# Patient Record
Sex: Male | Born: 1935 | Race: White | Hispanic: No | Smoking: Former smoker
Health system: Southern US, Community
[De-identification: ages and names within clinical notes are randomized; demographics above are authoritative.]

## PROBLEM LIST (undated history)

## (undated) DIAGNOSIS — G4730 Sleep apnea, unspecified: Secondary | ICD-10-CM

## (undated) DIAGNOSIS — C801 Malignant (primary) neoplasm, unspecified: Secondary | ICD-10-CM

## (undated) DIAGNOSIS — J449 Chronic obstructive pulmonary disease, unspecified: Secondary | ICD-10-CM

## (undated) DIAGNOSIS — E785 Hyperlipidemia, unspecified: Secondary | ICD-10-CM

## (undated) DIAGNOSIS — I10 Essential (primary) hypertension: Secondary | ICD-10-CM

## (undated) HISTORY — PX: JOINT REPLACEMENT: SHX530

## (undated) HISTORY — DX: Hyperlipidemia, unspecified: E78.5

## (undated) HISTORY — PX: CHOLECYSTECTOMY: SHX55

## (undated) HISTORY — PX: AORTIC VALVE REPLACEMENT: SHX41

## (undated) HISTORY — DX: Chronic obstructive pulmonary disease, unspecified: J44.9

## (undated) HISTORY — PX: PROSTATE SURGERY: SHX751

## (undated) HISTORY — DX: Sleep apnea, unspecified: G47.30

## (undated) HISTORY — DX: Essential (primary) hypertension: I10

---

## 1997-12-01 ENCOUNTER — Ambulatory Visit (HOSPITAL_COMMUNITY): Admission: RE | Admit: 1997-12-01 | Discharge: 1997-12-01 | Payer: Self-pay | Admitting: Orthopedic Surgery

## 1998-02-02 ENCOUNTER — Inpatient Hospital Stay (HOSPITAL_COMMUNITY): Admission: RE | Admit: 1998-02-02 | Discharge: 1998-02-06 | Payer: Self-pay | Admitting: Orthopedic Surgery

## 1998-02-08 ENCOUNTER — Encounter (HOSPITAL_COMMUNITY): Admission: RE | Admit: 1998-02-08 | Discharge: 1998-05-09 | Payer: Self-pay | Admitting: Orthopedic Surgery

## 1998-06-25 ENCOUNTER — Inpatient Hospital Stay (HOSPITAL_COMMUNITY): Admission: EM | Admit: 1998-06-25 | Discharge: 1998-07-05 | Payer: Self-pay | Admitting: Cardiology

## 1998-06-25 ENCOUNTER — Encounter: Payer: Self-pay | Admitting: Cardiology

## 1998-06-27 ENCOUNTER — Encounter: Payer: Self-pay | Admitting: Cardiothoracic Surgery

## 1998-06-28 ENCOUNTER — Encounter: Payer: Self-pay | Admitting: Cardiothoracic Surgery

## 1998-06-29 ENCOUNTER — Encounter: Payer: Self-pay | Admitting: Cardiothoracic Surgery

## 1998-06-30 ENCOUNTER — Encounter: Payer: Self-pay | Admitting: Cardiothoracic Surgery

## 1998-07-01 ENCOUNTER — Encounter: Payer: Self-pay | Admitting: Cardiothoracic Surgery

## 1998-07-25 ENCOUNTER — Encounter (HOSPITAL_COMMUNITY): Admission: RE | Admit: 1998-07-25 | Discharge: 1998-10-23 | Payer: Self-pay | Admitting: Internal Medicine

## 1999-03-18 ENCOUNTER — Emergency Department (HOSPITAL_COMMUNITY): Admission: EM | Admit: 1999-03-18 | Discharge: 1999-03-18 | Payer: Self-pay

## 1999-11-01 ENCOUNTER — Ambulatory Visit (HOSPITAL_COMMUNITY): Admission: RE | Admit: 1999-11-01 | Discharge: 1999-11-01 | Payer: Self-pay | Admitting: Cardiology

## 2000-07-06 ENCOUNTER — Emergency Department (HOSPITAL_COMMUNITY): Admission: EM | Admit: 2000-07-06 | Discharge: 2000-07-06 | Payer: Self-pay | Admitting: Emergency Medicine

## 2000-07-06 ENCOUNTER — Encounter: Payer: Self-pay | Admitting: Emergency Medicine

## 2000-07-08 ENCOUNTER — Encounter: Payer: Self-pay | Admitting: Internal Medicine

## 2000-07-08 ENCOUNTER — Encounter: Payer: Self-pay | Admitting: Emergency Medicine

## 2000-07-08 ENCOUNTER — Encounter (INDEPENDENT_AMBULATORY_CARE_PROVIDER_SITE_OTHER): Payer: Self-pay | Admitting: *Deleted

## 2000-07-08 ENCOUNTER — Inpatient Hospital Stay (HOSPITAL_COMMUNITY): Admission: EM | Admit: 2000-07-08 | Discharge: 2000-07-15 | Payer: Self-pay

## 2000-07-14 ENCOUNTER — Encounter: Payer: Self-pay | Admitting: Internal Medicine

## 2000-10-26 ENCOUNTER — Ambulatory Visit (HOSPITAL_COMMUNITY): Admission: RE | Admit: 2000-10-26 | Discharge: 2000-10-26 | Payer: Self-pay | Admitting: Gastroenterology

## 2000-12-04 ENCOUNTER — Inpatient Hospital Stay (HOSPITAL_COMMUNITY): Admission: RE | Admit: 2000-12-04 | Discharge: 2000-12-05 | Payer: Self-pay | Admitting: Urology

## 2003-09-11 ENCOUNTER — Emergency Department (HOSPITAL_COMMUNITY): Admission: EM | Admit: 2003-09-11 | Discharge: 2003-09-11 | Payer: Self-pay | Admitting: Emergency Medicine

## 2004-01-31 ENCOUNTER — Encounter: Admission: RE | Admit: 2004-01-31 | Discharge: 2004-01-31 | Payer: Self-pay

## 2004-11-05 ENCOUNTER — Encounter: Admission: RE | Admit: 2004-11-05 | Discharge: 2004-11-05 | Payer: Self-pay | Admitting: Cardiology

## 2004-11-05 ENCOUNTER — Ambulatory Visit (HOSPITAL_COMMUNITY): Admission: RE | Admit: 2004-11-05 | Discharge: 2004-11-05 | Payer: Self-pay | Admitting: Cardiology

## 2004-12-17 ENCOUNTER — Ambulatory Visit (HOSPITAL_COMMUNITY): Admission: RE | Admit: 2004-12-17 | Discharge: 2004-12-17 | Payer: Self-pay | Admitting: Neurology

## 2005-02-10 ENCOUNTER — Encounter: Admission: RE | Admit: 2005-02-10 | Discharge: 2005-02-10 | Payer: Self-pay | Admitting: Cardiology

## 2005-04-22 ENCOUNTER — Emergency Department (HOSPITAL_COMMUNITY): Admission: EM | Admit: 2005-04-22 | Discharge: 2005-04-22 | Payer: Self-pay | Admitting: Emergency Medicine

## 2005-06-10 ENCOUNTER — Ambulatory Visit: Payer: Self-pay | Admitting: Internal Medicine

## 2005-07-09 ENCOUNTER — Emergency Department (HOSPITAL_COMMUNITY): Admission: EM | Admit: 2005-07-09 | Discharge: 2005-07-09 | Payer: Self-pay | Admitting: Emergency Medicine

## 2005-07-30 ENCOUNTER — Ambulatory Visit: Payer: Self-pay | Admitting: Internal Medicine

## 2005-08-13 ENCOUNTER — Encounter: Payer: Self-pay | Admitting: Orthopedic Surgery

## 2005-08-20 ENCOUNTER — Ambulatory Visit (HOSPITAL_COMMUNITY): Admission: RE | Admit: 2005-08-20 | Discharge: 2005-08-20 | Payer: Self-pay | Admitting: Urology

## 2005-09-12 ENCOUNTER — Ambulatory Visit: Payer: Self-pay | Admitting: Internal Medicine

## 2005-10-08 ENCOUNTER — Emergency Department (HOSPITAL_COMMUNITY): Admission: EM | Admit: 2005-10-08 | Discharge: 2005-10-08 | Payer: Self-pay | Admitting: Emergency Medicine

## 2006-05-11 ENCOUNTER — Emergency Department (HOSPITAL_COMMUNITY): Admission: EM | Admit: 2006-05-11 | Discharge: 2006-05-11 | Payer: Self-pay | Admitting: Emergency Medicine

## 2006-06-24 ENCOUNTER — Ambulatory Visit (HOSPITAL_BASED_OUTPATIENT_CLINIC_OR_DEPARTMENT_OTHER): Admission: RE | Admit: 2006-06-24 | Discharge: 2006-06-24 | Payer: Self-pay | Admitting: Otolaryngology

## 2006-06-25 ENCOUNTER — Encounter: Payer: Self-pay | Admitting: Internal Medicine

## 2006-06-28 ENCOUNTER — Ambulatory Visit: Payer: Self-pay | Admitting: Internal Medicine

## 2006-10-20 ENCOUNTER — Ambulatory Visit: Payer: Self-pay | Admitting: Internal Medicine

## 2006-12-01 ENCOUNTER — Ambulatory Visit: Payer: Self-pay | Admitting: Internal Medicine

## 2006-12-31 ENCOUNTER — Ambulatory Visit: Payer: Self-pay | Admitting: Internal Medicine

## 2007-01-14 ENCOUNTER — Encounter (HOSPITAL_COMMUNITY): Admission: RE | Admit: 2007-01-14 | Discharge: 2007-02-16 | Payer: Self-pay | Admitting: Internal Medicine

## 2007-03-03 ENCOUNTER — Encounter: Payer: Self-pay | Admitting: Internal Medicine

## 2007-03-16 ENCOUNTER — Encounter (HOSPITAL_COMMUNITY): Admission: RE | Admit: 2007-03-16 | Discharge: 2007-04-13 | Payer: Self-pay | Admitting: Internal Medicine

## 2007-04-12 ENCOUNTER — Encounter: Payer: Self-pay | Admitting: Internal Medicine

## 2007-04-15 ENCOUNTER — Encounter (HOSPITAL_COMMUNITY): Admission: RE | Admit: 2007-04-15 | Discharge: 2007-05-14 | Payer: Self-pay | Admitting: Internal Medicine

## 2007-05-03 ENCOUNTER — Encounter: Payer: Self-pay | Admitting: Internal Medicine

## 2007-05-03 DIAGNOSIS — G4733 Obstructive sleep apnea (adult) (pediatric): Secondary | ICD-10-CM

## 2007-05-03 DIAGNOSIS — J449 Chronic obstructive pulmonary disease, unspecified: Secondary | ICD-10-CM

## 2007-05-03 DIAGNOSIS — J4489 Other specified chronic obstructive pulmonary disease: Secondary | ICD-10-CM | POA: Insufficient documentation

## 2007-05-16 ENCOUNTER — Encounter (HOSPITAL_COMMUNITY): Admission: RE | Admit: 2007-05-16 | Discharge: 2007-08-14 | Payer: Self-pay | Admitting: Internal Medicine

## 2007-06-25 ENCOUNTER — Ambulatory Visit: Payer: Self-pay | Admitting: Internal Medicine

## 2007-06-26 DIAGNOSIS — I10 Essential (primary) hypertension: Secondary | ICD-10-CM | POA: Insufficient documentation

## 2007-06-26 DIAGNOSIS — E785 Hyperlipidemia, unspecified: Secondary | ICD-10-CM

## 2007-12-27 ENCOUNTER — Ambulatory Visit: Payer: Self-pay | Admitting: Internal Medicine

## 2008-03-06 ENCOUNTER — Telehealth: Payer: Self-pay | Admitting: Internal Medicine

## 2008-03-15 ENCOUNTER — Encounter: Payer: Self-pay | Admitting: Internal Medicine

## 2008-06-26 ENCOUNTER — Ambulatory Visit: Payer: Self-pay | Admitting: Internal Medicine

## 2008-10-02 ENCOUNTER — Telehealth (INDEPENDENT_AMBULATORY_CARE_PROVIDER_SITE_OTHER): Payer: Self-pay | Admitting: *Deleted

## 2008-12-25 ENCOUNTER — Ambulatory Visit: Payer: Self-pay | Admitting: Internal Medicine

## 2009-06-26 ENCOUNTER — Ambulatory Visit: Payer: Self-pay | Admitting: Internal Medicine

## 2009-08-06 ENCOUNTER — Telehealth: Payer: Self-pay | Admitting: Internal Medicine

## 2009-08-09 ENCOUNTER — Telehealth (INDEPENDENT_AMBULATORY_CARE_PROVIDER_SITE_OTHER): Payer: Self-pay | Admitting: *Deleted

## 2009-08-10 ENCOUNTER — Ambulatory Visit: Payer: Self-pay | Admitting: Internal Medicine

## 2009-08-31 ENCOUNTER — Telehealth (INDEPENDENT_AMBULATORY_CARE_PROVIDER_SITE_OTHER): Payer: Self-pay | Admitting: *Deleted

## 2009-09-12 HISTORY — PX: SHOULDER SURGERY: SHX246

## 2009-09-21 ENCOUNTER — Inpatient Hospital Stay (HOSPITAL_COMMUNITY): Admission: RE | Admit: 2009-09-21 | Discharge: 2009-09-23 | Payer: Self-pay | Admitting: Orthopedic Surgery

## 2009-12-07 ENCOUNTER — Ambulatory Visit: Payer: Self-pay | Admitting: Internal Medicine

## 2010-01-03 ENCOUNTER — Ambulatory Visit (HOSPITAL_BASED_OUTPATIENT_CLINIC_OR_DEPARTMENT_OTHER): Admission: RE | Admit: 2010-01-03 | Discharge: 2010-01-03 | Payer: Self-pay | Admitting: Orthopedic Surgery

## 2010-02-23 ENCOUNTER — Emergency Department (HOSPITAL_COMMUNITY): Admission: EM | Admit: 2010-02-23 | Discharge: 2010-02-23 | Payer: Self-pay | Admitting: Emergency Medicine

## 2010-03-05 ENCOUNTER — Emergency Department (HOSPITAL_COMMUNITY): Admission: EM | Admit: 2010-03-05 | Discharge: 2010-03-05 | Payer: Self-pay | Admitting: Emergency Medicine

## 2010-04-21 ENCOUNTER — Encounter: Payer: Self-pay | Admitting: Internal Medicine

## 2010-05-14 NOTE — Progress Notes (Signed)
Summary: sob-APPT SCHED  Phone Note Call from Patient Call back at Home Phone [phone]   Caller: Patient Call For: young Reason for Call: Talk to Nurse Summary of Call: sob, can't walk across room without rest.  Wants to know if he should try an inhaler again? [facility] Initial call taken by: Eugene Gavia,  August 09, 2009 11:29 AM  Follow-up for Phone Call        Spoke with pt.  He is out of breath walking from room to room at home.  He states that he feels like he is unable to do anything anymore without getting out of breath.  OV with [facility] sched for tommorrow at 3:30 pm.  ER sooner if needed. Follow-up by: Vernie Murders,  August 09, 2009 11:43 AM

## 2010-05-14 NOTE — Assessment & Plan Note (Signed)
Summary: rov 6 months///kp   Primary Provider/Referring Provider:  Jacky Kindle  CC:  Follow up visit-SOB.Marland Kitchen  History of Present Illness: 06/26/08- OSA, COPD "Can't wear oxygen" all the time- admits partly vanity, but does wear it with cpap at night. C/O chronic throat clearing without awareness of reflux, postnasal drip or dysphagia. Denies change in exercise tolerance, productive cough, chest pain, palpitation or edema. Sleep onset easy with med but often wakes 3 AM and may need to take cpap off to get back to sleep.  January 18, 2009 OSA, COPD Asks about dysphagia- painless, several months, no choke, reflux. He turns down referral for MBS and chooses to live with it.  Otherwise  he says he feels well for his age.  Wears CPAP every night but sometimes takes it off after a few hours. He feels Ambien 10 mg and Clonazepam works about the same, providing sleep latency of 10 minutes at 10PM then waking at 3-330 AM and dozing off and on til 6-7AM without hangover. Breathing during day is satisfactory to him. He wears oxygen at night but doesn't like it during day. He paces himself and doesn't panic if he gets a little breathless.   June 26, 2009- COPD, OSA CXR 01/18/2009- COPD, NAD. CPAP 10- with O2 Asks about life-line type pendant. He lives alone at age 60. I supported this. He notes he doesn't have enough wind power to blow his nose esily. He still tries to play some golf occasionally- cart up to the green.Nadene Rubins about his grandaughter. COPD- FEV1 07/2005.FEV1 1.89, FEV1/FVC 0.60 with response to BD. Not much cough. DOE out to get paper. On return will stop at steps to get his breath. Little change since last visit. He wears oxygen 2 l sleep, but turned in his portable. Not using any bronchodilators.     Current Medications (verified): 1)  Omeprazole 20 Mg  Tbec (Omeprazole) .... Take 1 Tablet By Mouth Once A Day 2)  Felodipine 10 Mg  Tb24 (Felodipine) .... Take 1 Tablet By Mouth Once A Day 3)   Hydrochlorothiazide 25 Mg  Tabs (Hydrochlorothiazide) .... Take 1 Tablet By Mouth Once A Day 4)  Atenolol 50 Mg  Tabs (Atenolol) .... Take 1 Tablet By Mouth Once A Day 5)  Simvastatin 80 Mg  Tabs (Simvastatin) .... Take 1 Tablet By Mouth Once A Day 6)  Bayer Aspirin Ec Low Dose 81 Mg  Tbec (Aspirin) .... Take 1 Tablet By Mouth Once A Day 7)  Clonazepam 0.5 Mg Tabs (Clonazepam) .Marland Kitchen.. 1-2 At Bedtime As Needed For Sleep 8)  Amlodipine Besylate 5 Mg  Tabs (Amlodipine Besylate) .... Take 1 By Mouth Once Daily 9)  Cpap 10 With O2 2l 10)  Ambien 10 Mg Tabs (Zolpidem Tartrate) .... Take 1 Tab By Mouth At Bedtime As Needed  Allergies (verified): No Known Drug Allergies  Past History:  Past Medical History: Last updated: 06/25/2007 C O P D Sleep Apnea Hyperlipidemia Hypertension  Past Surgical History: Last updated: 06/25/2007 aortic valve replacement Cholecystectomy  Family History: Last updated: 01/18/2009 brother- COPD- died 1 sister living Father- died age 62, diabetic, CVA  Social History: Last updated: 12/27/2007 Patient states former smoker.  Wife, son and grandson are all dead  Risk Factors: Smoking Status: quit (06/25/2007)  Review of Systems      See HPI       The patient complains of dyspnea on exertion.  The patient denies anorexia, fever, weight loss, weight gain, vision loss, decreased hearing, hoarseness, chest pain, syncope,  peripheral edema, prolonged cough, headaches, hemoptysis, abdominal pain, and severe indigestion/heartburn.    Vital Signs:  Patient profile:   77 year old male Height:      72 inches Weight:      192.38 pounds O2 Sat:      91 % on Room air Pulse rate:   65 / minute BP sitting:   130 / 80  (left arm) Cuff size:   regular  Vitals Entered By: Reynaldo Minium CMA (June 26, 2009 9:02 AM)  O2 Flow:  Room air  Physical Exam  Additional Exam:  General: A/Ox3; pleasant and cooperative, NAD, medium build SKIN: no rash, lesions NODES: no  lymphadenopathy HEENT: [place]/AT, EOM- WNL, Conjuctivae- clear, PERRLA, TM-WNL, Nose- clear, Throat- clear with some cobblestoning, Mallampatii II, no stridor, voice normal for age, [name] without stridor, doing some mild throat clearing while here. NECK: Supple w/ fair ROM, JVD- none, normal carotid impulses w/o bruits Thyroid-  CHEST: Very distant breath sounds unlabored without wheeze or cough HEART: RRR, no m/g/r heard ABDOMEN soft VQQ:VZDG, nl pulses, no edema , red scaling dermatitis in stocking distribution both lower legs- noted again this visit. NEURO: Grossly intact to observation      Impression & Recommendations:  Problem # 1:  C O P D (ICD-496) Unchanged. He had response to BD in 2007, but it isn't evident now on exam. I emphasized staying active. CXR stable on review.  Problem # 2:  OBSTRUCTIVE SLEEP APNEA (ICD-327.23) He still uses cpap every night,  but is more aware of waking around 2 AM and we discussed sleep hygeine.He may still use ambien as needed.  Other Orders: Est. Patient Level III (38756)  Patient Instructions: 1)  Please schedule a follow-up appointment in 6 months. 2)  continue CPAP with oxygen at night and call as needed.

## 2010-05-14 NOTE — Progress Notes (Signed)
Summary: med records  Phone Note From Other Clinic Call back at [phone]   Caller: Diane @ Dr. Lanell Mat[facility] Office Call For: [name] [name] Summary of Call: need latest office notes on pt from Dr. Maple [name]  -  Fax# [phone] Initial call taken by: Eugene Gavia,  Aug 31, 2009 12:01 PM  Follow-up for Phone Call        Faxed notes on 5/20.//[name] Follow-up by: Darletta Moll,  Sep 03, 2009 8:09 AM

## 2010-05-14 NOTE — Assessment & Plan Note (Signed)
Summary: sob//lmr   Primary Provider/Referring Provider:  Jacky Kindle  CC:  Pt c/o SOB with exertion starting Tuesday after inhaling smoke when cooking. Pt requesting to discuss oxygen treatment and medications.  History of Present Illness: 12/25/08 OSA, COPD Asks about dysphagia- painless, several months, no choke, reflux. He turns down referral for MBS and chooses to live with it.  Otherwise  he says he feels well for his age.  Wears CPAP every night but sometimes takes it off after a few hours. He feels Ambien 10 mg and Clonazepam works about the same, providing sleep latency of 10 minutes at 10PM then waking at 3-330 AM and dozing off and on til 6-7AM without hangover. Breathing during day is satisfactory to him. He wears oxygen at night but doesn't like it during day. He paces himself and doesn't panic if he gets a little breathless.   June 26, 2009- COPD, OSA CXR 12/25/08- COPD, NAD. CPAP 10- with O2 Asks about life-line type pendant. He lives alone at age 57. I supported this. He notes he doesn't have enough wind power to blow his nose esily. He still tries to play some golf occasionally- cart up to the green.Nadene Rubins about his grandaughter. COPD- FEV1 07/2005.FEV1 1.89, FEV1/FVC 0.60 with response to BD. Not much cough. DOE out to get paper. On return will stop at steps to get his breath. Little change since last visit. He wears oxygen 2 l sleep, but turned in his portable. Not using any bronchodilators.  08-21-2009- COPD, OSA Overheated some fatback cooking. It took his breath for about 2 hours. He is back to his baseline, but the experience rattled him. He didn't have a rescue inhaler or portable oxygen.Marland Kitchen He asks again about the portable oxygen he turned back. He stil uses oxygen at night with his CPAP. He wheezes intermittently. Asks help with retained secretions.    Current Medications (verified): 1)  Omeprazole 20 Mg  Tbec (Omeprazole) .... Take 1 Tablet By Mouth Once A  Day 2)  Felodipine 10 Mg  Tb24 (Felodipine) .... Take 1 Tablet By Mouth Once A Day (Pt Unsure) 3)  Hydrochlorothiazide 25 Mg  Tabs (Hydrochlorothiazide) .... Take 1 Tablet By Mouth Once A Day 4)  Atenolol 50 Mg  Tabs (Atenolol) .... Take 1 Tablet By Mouth Once A Day 5)  Simvastatin 80 Mg  Tabs (Simvastatin) .... Take 1/2 Tablet By Mouth Once A Day 6)  Bayer Aspirin Ec Low Dose 81 Mg  Tbec (Aspirin) .... Take 1 Tablet By Mouth Once A Day 7)  Clonazepam 0.5 Mg Tabs (Clonazepam) .Marland Kitchen.. 1-2 At Bedtime As Needed For Sleep 8)  Amlodipine Besylate 5 Mg  Tabs (Amlodipine Besylate) .... Take 1 By Mouth Once Daily 9)  Cpap 10 With O2 2l 10)  Ambien 10 Mg Tabs (Zolpidem Tartrate) .... Take 1 Tab By Mouth At Bedtime As Needed  Allergies (verified): No Known Drug Allergies  Past History:  Past Medical History: Last updated: 06/25/2007 C O P D Sleep Apnea Hyperlipidemia Hypertension  Past Surgical History: Last updated: 06/25/2007 aortic valve replacement Cholecystectomy  Family History: Last updated: 08/21/2009 brother- COPD- died 1 sister living Father- died age 77, diabetic, CVA  Social History: Last updated: 12/27/2007 Patient states former smoker.  Wife, son and grandson are all dead  Risk Factors: Smoking Status: quit (06/25/2007)  Family History: brother- COPD- died 1 sister living Father- died age 8, diabetic, CVA  Review of Systems      See HPI  The  patient denies anorexia, fever, weight loss, weight gain, vision loss, decreased hearing, hoarseness, chest pain, syncope, dyspnea on exertion, peripheral edema, prolonged cough, headaches, hemoptysis, and severe indigestion/heartburn.    Vital Signs:  Patient profile:   77 year old male Height:      72 inches Weight:      192.13 pounds BMI:     26.15 O2 Sat:      86 % on Room air Pulse rate:   77 / minute BP sitting:   104 / 70  (left arm) Cuff size:   regular  Vitals Entered By: Zackery Barefoot CMA (August 10, 2009 3:56 PM)  O2 Flow:  Room air CC: Pt c/o SOB with exertion starting Tuesday after inhaling smoke when cooking. Pt requesting to discuss oxygen treatment and medications Comments Upon arrival pt's O2 86% RA, after rest pt recovered to 92% RA. Zackery Barefoot CMA  August 10, 2009 3:58 PM    Physical Exam  Additional Exam:  General: A/Ox3; pleasant and cooperative, NAD, medium build SKIN: no rash, lesions NODES: no lymphadenopathy HEENT: [place]/AT, EOM- WNL, Conjuctivae- clear, PERRLA, TM-WNL, Nose- clear, Throat- clear with some cobblestoning, Mallampatii II, no stridor, voice normal for age, [name] without stridor, doing some mild throat clearing while here. NECK: Supple w/ fair ROM, JVD- none, normal carotid impulses w/o bruits Thyroid-  CHEST: Very distant breath sounds unlabored without wheeze or cough HEART: RRR, no m/g/r heard ABDOMEN soft ZOX:WRUE, nl pulses, no edema , red scaling dermatitis in stocking distribution both lower legs- noted again this visit. NEURO: Grossly intact to observation      Impression & Recommendations:  Problem # 1:  COPD (ICD-496) He is considering accepting portable oxygen back. We discussed medical indications, noting his arrival sat of 86% today on room air he admits his reluctance is based on "vanity". We talked carefully about use of oxygen to reduce risk of cardiac event or stroke.Marland Kitchen Previous PFT did show reversibility with dilator. We will give a rescue inhlaer for as needed use to start with. He is desensitized to dyspnea and it is harder for him to see the benefit of these treatments..  Medications Added to Medication List This Visit: 1)  Felodipine 10 Mg Tb24 (Felodipine) .... Take 1 tablet by mouth once a day (pt unsure) 2)  Simvastatin 80 Mg Tabs (Simvastatin) .... Take 1/2 tablet by mouth once a day 3)  Proair Hfa 108 (90 Base) Mcg/act Aers (Albuterol sulfate) .... 2 puffs four times a day as needed rescue inhaler  Other Orders: Est.  Patient Level II (45409) Prescription Created Electronically [id number]) HFA Instruction [phone])  Patient Instructions: 1)  Keep scheduled appointment- call sooner if needed 2)  script for rescue inhaler Proair was sent to your drug store 3)  MDI instruction done by nurse. 4)  Consider letting us put portable oxygen back in your home. Prescriptions: PROAIR HFA 108 (90 BASE) MCG/ACT AERS (ALBUTEROL SULFATE) 2 puffs four times a day as needed rescue inhaler  #1 x prn   Entered and Authorized by:   Waymon Budge MD   Signed by:   Waymon Budge MD on 08/10/2009   Method used:   Electronically to        [facility]* (retail)       [address]       [place], [place]  [id number]       Ph: [phone]       Fax: [phone]   RxID:  [id number]  

## 2010-05-14 NOTE — Assessment & Plan Note (Signed)
Summary: 6 months/apc   Primary Provider/Referring Provider:  Jacky Kindle  CC:  6 month follow up visit-COPD.  History of Present Illness:  June 26, 2009- COPD, OSA CXR 12/25/08- COPD, NAD. CPAP 10- with O2 Asks about life-line type pendant. He lives alone at age 77. I supported this. He notes he doesn't have enough wind power to blow his nose esily. He still tries to play some golf occasionally- cart up to the green.Nadene Rubins about his grandaughter. COPD- FEV1 07/2005.FEV1 1.89, FEV1/FVC 0.60 with response to BD. Not much cough. DOE out to get paper. On return will stop at steps to get his breath. Little change since last visit. He wears oxygen 2 l sleep, but turned in his portable. Not using any bronchodilators.  2009/09/09- COPD, OSA Overheated some fatback cooking. It took his breath for about 2 hours. He is back to his baseline, but the experience rattled him. He didn't have a rescue inhaler or portable oxygen.Marland Kitchen He asks again about the portable oxygen he turned back. He stil uses oxygen at night with his CPAP. He wheezes intermittently. Asks help with retained secretions.  December 07, 2009- COPD, OSA After shoulder surgery in June he was dyspneic/ fatigued in PT. Found O2 sat would drop into 80's. Denies acute respiratory event. Not very active. Some cough/ white phlegm. No chest pain or edema. He continues cpap 10 with O2 2L, but he never sleeps through the night and is just catnapping after 3AM. Still takes one clonazepam at bedtime. if he runs out he may take an [place], but he doesn't combine them. Mask fits ok but annoys him. We discussed mask styles. Discussed report of dementia in people with sleep apnea who don't use CPAP.      Preventive Screening-Counseling & Management  Alcohol-Tobacco     Smoking Status: quit > 6 months     Tobacco Counseling: not to resume use of tobacco products  Current Medications (verified): 1)  Omeprazole 20 Mg  Tbec (Omeprazole) .... Take 1  Tablet By Mouth Once A Day 2)  Felodipine 10 Mg  Tb24 (Felodipine) .... Take 1 Tablet By Mouth Once A Day (Pt Unsure) 3)  Hydrochlorothiazide 25 Mg  Tabs (Hydrochlorothiazide) .... Take 1 Tablet By Mouth Once A Day 4)  Atenolol 50 Mg  Tabs (Atenolol) .... Take 1 Tablet By Mouth Once A Day 5)  Simvastatin 80 Mg  Tabs (Simvastatin) .... Take 1/2 Tablet By Mouth Once A Day 6)  Bayer Aspirin Ec Low Dose 81 Mg  Tbec (Aspirin) .... Take 1 Tablet By Mouth Once A Day 7)  Clonazepam 0.5 Mg Tabs (Clonazepam) .Marland Kitchen.. 1-2 At Bedtime As Needed For Sleep 8)  Amlodipine Besylate 5 Mg  Tabs (Amlodipine Besylate) .... Take 1 By Mouth Once Daily 9)  Cpap 10 With O2 2l 10)  Ambien 10 Mg Tabs (Zolpidem Tartrate) .... Take 1 Tab By Mouth At Bedtime As Needed 11)  Proair Hfa 108 (90 Base) Mcg/act Aers (Albuterol Sulfate) .... 2 Puffs Four Times A Day As Needed Rescue Inhaler  Allergies (verified): No Known Drug Allergies  Past History:  Past Medical History: Last updated: 06/25/2007 C O P D Sleep Apnea Hyperlipidemia Hypertension  Family History: Last updated: 09/09/09 brother- COPD- died 1 sister living Father- died age 69, diabetic, CVA  Social History: Last updated: 12/27/2007 Patient states former smoker.  Wife, son and grandson are all dead  Risk Factors: Smoking Status: quit > 6 months (12/07/2009)  Past Surgical History: aortic valve  replacement Cholecystectomy left shoulder- june, 2011  Social History: Smoking Status:  quit > 6 months  Review of Systems      See HPI       The patient complains of shortness of breath with activity and shortness of breath at rest.  The patient denies productive cough, non-productive cough, coughing up blood, chest pain, irregular heartbeats, acid heartburn, indigestion, loss of appetite, weight change, abdominal pain, difficulty swallowing, sore throat, tooth/dental problems, headaches, nasal congestion/difficulty breathing through nose, and sneezing.     Vital Signs:  Patient profile:   77 year old male Height:      72 inches Weight:      190 pounds BMI:     25.86 O2 Sat:      90 % on 2 L/min Pulse rate:   62 / minute BP sitting:   118 / 72  (left arm) Cuff size:   regular  Vitals Entered By: Reynaldo Minium CMA (December 07, 2009 2:40 PM)  O2 Flow:  2 L/min CC: 6 month follow up visit-COPD   Physical Exam  Additional Exam:  General: A/Ox3; pleasant and cooperative, NAD, medium build SKIN: no rash, lesions NODES: no lymphadenopathy HEENT: [place]/AT, EOM- WNL, Conjuctivae- clear, PERRLA, TM-WNL, Nose- clear, Throat- clear with some cobblestoning, Mallampatii II, no stridor, voice normal for age, [name] without stridor, doing some mild throat clearing while here. NECK: Supple w/ fair ROM, JVD- none, normal carotid impulses w/o bruits Thyroid-  CHEST: Very distant breath sounds unlabored without wheeze or cough. On demand regulator O2 at 2L his sat is 90%. HEART: RRR, no m/g/r heard ABDOMEN soft ZOX:WRUE, nl pulses, no edema , red scaling dermatitis in stocking distribution both lower legs- noted again this visit. NEURO: Grossly intact to observation      Impression & Recommendations:  Problem # 1:  COPD (ICD-496) Severe COPD. I suggested he be quick to increase to 3L/min if he feels dyspneic.  There is not active bronchospasm.  Peripheral vascular disease evident from skin changes in lower legs.  Problem # 2:  OBSTRUCTIVE SLEEP APNEA (ICD-327.23)  He doesn't like it, but he struggles to remain compliant with CPAP. I talked with him about evaluating his mask through his DME company.  Other Orders: Est. Patient Level III (45409)  Patient Instructions: 1)  Please schedule a follow-up appointment in 6 months. 2)  Talk with the home care company to see what different CPAP styles they have. You may like a style called "nasal pillows" 3)  If you get short of breath, it will be okay to turn your oxygen up to 3

## 2010-05-14 NOTE — Progress Notes (Signed)
Summary: speak to nurse  Phone Note Call from Patient Call back at Home Phone [phone]   Caller: Patient Call For: young Summary of Call: Pt states that he doesn't use o2 during the day but lately he's been out of breath during exertion and today he was gasping for breath after exertion wants to know what he should do. Initial call taken by: Darletta Moll,  August 06, 2009 11:50 AM  Follow-up for Phone Call        Pt staets he was cooking some fat back and had stove turned on too high and it started to smoke up his kitchen. He states after that he began to have difficulty breathing, and felt like he was "gasping" for air. He states he then felt like he was having a panic attack because he couldnt breath. he states he feels back to normal now, his son helped him relax. Pt wants to know what he should do if this happens a gain. Please advise. Thanks. Carron Curie CMA  August 06, 2009 3:07 PM allergies: NKDA  Additional Follow-up for Phone Call Additional follow up Details #1::        Per CDY- call us earlier when this happens.Reynaldo Minium CMA  August 06, 2009 3:46 PM   pt advised. Carron Curie CMA  August 06, 2009 3:54 PM

## 2010-05-16 NOTE — Letter (Signed)
Summary: CMN for DME / [facility]   CMN for DME / [employer] [facility]   Imported By: Lennie Odor 04/26/2010 10:41:17  _____________________________________________________________________  External Attachment:    Type:   Image     Comment:   External Document

## 2010-06-06 ENCOUNTER — Ambulatory Visit (INDEPENDENT_AMBULATORY_CARE_PROVIDER_SITE_OTHER): Payer: Medicare Other | Admitting: Internal Medicine

## 2010-06-06 ENCOUNTER — Encounter: Payer: Self-pay | Admitting: Internal Medicine

## 2010-06-06 DIAGNOSIS — G4733 Obstructive sleep apnea (adult) (pediatric): Secondary | ICD-10-CM

## 2010-06-06 DIAGNOSIS — J4489 Other specified chronic obstructive pulmonary disease: Secondary | ICD-10-CM

## 2010-06-06 DIAGNOSIS — J449 Chronic obstructive pulmonary disease, unspecified: Secondary | ICD-10-CM

## 2010-06-11 NOTE — Assessment & Plan Note (Signed)
Summary: rov 6 month/kp   Primary Provider/Referring Provider:  [name]  CC:  6 month follow up visit-OSA; not using CPAP x 1 week just O2 at night; still waking up .  History of Present Illness: 08-13-2009- COPD, OSA Overheated some fatback cooking. It took his breath for about 2 hours. He is back to his baseline, but the experience rattled him. He didn't have a rescue inhaler or portable oxygen.Marland Kitchen He asks again about the portable oxygen he turned back. He stil uses oxygen at night with his CPAP. He wheezes intermittently. Asks help with retained secretions.  December 07, 2009- COPD, OSA After shoulder surgery in June he was dyspneic/ fatigued in PT. Found O2 sat would drop into 80's. Denies acute respiratory event. Not very active. Some cough/ white phlegm. No chest pain or edema. He continues cpap 10 with O2 2L, but he never sleeps through the night and is just catnapping after 3AM. Still takes one clonazepam at bedtime. if he runs out he may take an [place], but he doesn't combine them. Mask fits ok but annoys him. We discussed mask styles. Discussed report of dementia in people with sleep apnea who don't use CPAP.  June 06, 2010- COPD, OSA, Chronic insomnia Nurse-CC: 6 month follow up visit-OSA; not using CPAP x 1 week just O2 at night; still waking up  CPAP 10- Not being used . He says he is more comfortable using nasal prong oxygen alone.  He goes to sleep around 10, then wakes 3-AM. Just naps after that. Denies siginficant daytime sleepiness. He is only taking [place] now.  Keeps O2 on continuously.     Preventive Screening-Counseling & Management  Alcohol-Tobacco     Smoking Status: quit > 6 months     Year Quit: 1970     Tobacco Counseling: not to resume use of tobacco products  Current Medications (verified): 1)  Omeprazole 20 Mg  Tbec (Omeprazole) .... Take 1 Tablet By Mouth Once A Day 2)  Felodipine 10 Mg  Tb24 (Felodipine) .... Take 1 Tablet By Mouth Once A Day (Pt  Unsure) 3)  Hydrochlorothiazide 25 Mg  Tabs (Hydrochlorothiazide) .... Take 1 Tablet By Mouth Once A Day 4)  Atenolol 50 Mg  Tabs (Atenolol) .... Take 1 Tablet By Mouth Once A Day 5)  Simvastatin 80 Mg  Tabs (Simvastatin) .... Take 1/2 Tablet By Mouth Once A Day 6)  Bayer Aspirin Ec Low Dose 81 Mg  Tbec (Aspirin) .... Take 1 Tablet By Mouth Once A Day 7)  Clonazepam 0.5 Mg Tabs (Clonazepam) .Marland Kitchen.. 1-2 At Bedtime As Needed For Sleep 8)  Amlodipine Besylate 5 Mg  Tabs (Amlodipine Besylate) .... Take 1 By Mouth Once Daily 9)  Cpap 10 With O2 2l 10)  Proair Hfa 108 (90 Base) Mcg/act Aers (Albuterol Sulfate) .... 2 Puffs Four Times A Day As Needed Rescue Inhaler  Allergies (verified): No Known Drug Allergies  Past History:  Past Medical History: Last updated: 06/25/2007 C O P D Sleep Apnea Hyperlipidemia Hypertension  Past Surgical History: Last updated: 12/07/2009 aortic valve replacement Cholecystectomy left shoulder- june, 2011  Family History: Last updated: Aug 13, 2009 brother- COPD- died 1 sister living Father- died age 10, diabetic, CVA  Social History: Last updated: 12/27/2007 Patient states former smoker.  Wife, son and grandson are all dead  Risk Factors: Smoking Status: quit > 6 months (06/06/2010)  Review of Systems      See HPI       The patient complains of  shortness of breath with activity.  The patient denies shortness of breath at rest, productive cough, non-productive cough, coughing up blood, chest pain, irregular heartbeats, acid heartburn, indigestion, loss of appetite, weight change, abdominal pain, difficulty swallowing, sore throat, tooth/dental problems, headaches, nasal congestion/difficulty breathing through nose, and sneezing.    Vital Signs:  Patient profile:   77 year old male Height:      72 inches Weight:      185.50 pounds BMI:     25.25 O2 Sat:      90 % on 2 L/min Pulse rate:   72 / minute BP sitting:   120 / 64  (right arm) Cuff size:    regular  Vitals Entered By: [name] [name] CMA (June 06, 2010 1:37 PM)  O2 Flow:  2 L/min CC: 6 month follow up visit-OSA; not using CPAP x 1 week just O2 at night; still waking up    Physical Exam  Additional Exam:  General: A/Ox3; pleasant and cooperative, NAD, medium build SKIN: no rash, lesions NODES: no lymphadenopathy HEENT: Petroleum/AT, EOM- WNL, Conjuctivae- clear, PERRLA, TM-WNL, Nose- clear, Throat- clear , Mallampatii II, no stridor, voice normal for age, [name] without stridor, doing some mild throat clearing while here. NECK: Supple w/ fair ROM, JVD- none, normal carotid impulses w/o bruits Thyroid-  CHEST: Very distant breath sounds unlabored without wheeze or cough. On demand regulator O2 at 2L his sat is 90% again today HEART: RRR, no m/g/r heard ABDOMEN soft ZOX:WRUE, nl pulses, no edema , red scaling dermatitis in stocking distribution both lower legs- noted again this visit. NEURO: Grossly intact to observation      Impression & Recommendations:  Problem # 1:  OBSTRUCTIVE SLEEP APNEA (ICD-327.23)  Not using CPAP. We will try to better define the  situation for him, respecting his wishes. At age 57 he is mentally alert and seems competent to make his own decisions.He may be covered well enough using just nasal oxygen. Plan ONOX on O2.  Problem # 2:  COPD (ICD-496) Oxygen dependent  respiratory failure, but doing pretty well now.   Other Orders: Est. Patient Level III (45409) DME Referral (DME)  Patient Instructions: 1)  Please schedule a follow-up appointment in 6 months. 2)  We will check an overnight oxygen saturation check. Plan to wear your oxygen , but not the CPAP for that study night.  3)  See [facility] too set up overnight oximetry

## 2010-07-01 LAB — BASIC METABOLIC PANEL
Calcium: 9.2 mg/dL (ref 8.4–10.5)
GFR calc Af Amer: >60 mL/min (ref >60)
GFR calc non Af Amer: >60 mL/min (ref >60)
GFR calc non Af Amer: >60 mL/min (ref >60)
Glucose, Bld: 141 mg/dL — ABNORMAL HIGH (ref 70–99)
Potassium: 3.5 mEq/L (ref 3.5–5.1)
Sodium: 139 mEq/L (ref 135–145)
Sodium: 140 mEq/L (ref 135–145)

## 2010-07-01 LAB — URINALYSIS, ROUTINE W REFLEX MICROSCOPIC
Ketones, ur: NEGATIVE mg/dL
Nitrite: NEGATIVE
Specific Gravity, Urine: 1.023 (ref 1.005–1.030)
pH: 6 (ref 5.0–8.0)

## 2010-07-01 LAB — DIFFERENTIAL
Lymphocytes Relative: 25 % (ref 12–46)
Lymphs Abs: 1.7 10*3/uL (ref 0.7–4.0)
Monocytes Absolute: 0.7 10*3/uL (ref 0.1–1.0)
Monocytes Relative: 11 % (ref 3–12)
Neutro Abs: 4.2 10*3/uL (ref 1.7–7.7)

## 2010-07-01 LAB — GLUCOSE, CAPILLARY
Glucose-Capillary: 148 mg/dL — ABNORMAL HIGH (ref 70–99)
Glucose-Capillary: 153 mg/dL — ABNORMAL HIGH (ref 70–99)

## 2010-07-01 LAB — HEMOGLOBIN AND HEMATOCRIT, BLOOD
HCT: 39.6 % (ref 39.0–52.0)
Hemoglobin: 13.6 g/dL (ref 13.0–17.0)

## 2010-07-01 LAB — APTT: aPTT: 33 seconds (ref 24–37)

## 2010-07-01 LAB — CBC
Hemoglobin: 15 g/dL (ref 13.0–17.0)
RBC: 4.89 MIL/uL (ref 4.22–5.81)

## 2010-07-03 ENCOUNTER — Other Ambulatory Visit: Payer: Self-pay | Admitting: *Deleted

## 2010-07-03 MED ORDER — ZOLPIDEM TARTRATE 10 MG PO TABS: 10.0000 mg | ORAL_TABLET | Freq: Every evening | ORAL | Status: DC | PRN

## 2010-07-03 MED ORDER — CLONAZEPAM 0.5 MG PO TABS: ORAL_TABLET | ORAL | Status: DC

## 2010-07-03 NOTE — Telephone Encounter (Signed)
Request faxed from [facility] for Clonazepam and Zolpidem. Okay to refill per CDY. RX for both medications called to the pharmacy.

## 2010-08-27 NOTE — Assessment & Plan Note (Signed)
[facility]                             PULMONARY OFFICE NOTE   RAAHIL, ONG                    MRN:          [id number]  DATE:10/20/2006                            DOB:          [birth year]    PULMONARY CONSULTATION   PROBLEM:  A 77 year old gentleman referred through the courtesy of Dr.  Annalee Genta because of obstructive sleep apnea.   HISTORY:  He has had a sleep study done 6 or 7 years ago, which was  positive but he did not follow through for therapy at that time.  A  repeat study done June 25, 2006 demonstrated moderately severe  obstructive apnea with an index of 21.6 per hour, moderate snoring, and  oxygen desaturation nadir of 78%.  By split-study protocol, CPAP was  titrated to 15 CWP for an AHI of 0 per hour.  Persistent marginal  oxygenation was reported with a saturation 88-90% on room air even with  CPAP.  He tried CPAP through [facility]  He was also put on home oxygen by Dr. Deborah Chalk, but he disliked the CPAP  and has not been using the oxygen.  He notices very gradual increasing  shortness of breath with exertion over the past year, but is comfortable  at rest.  A fried tells him he snores, even with CPAP on, and he opens  his mouth in his sleep.  He complains the CPAP woke him frequently, and  he has noted persistent daytime sleepiness.  The CPAP was not really  uncomfortable, just bothersome.  He is concerned about his overall  pulmonary status.  Dr. Jacky Kindle had done a chest x-ray a few weeks ago,  said to be okay, and also blood work said to be okay.  Typical  bedtime is 9:30 to 10 p.m.  Estimated 10 minutes sleep latency, waking 4  or 5 times during the night.   MEDICATIONS:  1. Omeprazole 20 mg x1/2.  2. Felodipine 10 mg.  3. Hydrochlorothiazide 25 mg.  4. Atenolol 50 mg.  5. Simvastatin 80 mg.  6. CPAP set at 15.  7. Oxygen at night at 2L.  8. One aspirin daily.   No  medication allergy.   REVIEW OF SYSTEMS:  Snoring, witnessed apneas, daytime sleepiness if  inactive, difficulty maintaining sleep at night.  He does not report  choking, coughing, or shortness of breath, particularly at night.   PAST HISTORY:  Hypertension, asthma, bilateral total knee replacement,  heart valve, coronary artery bypass grafting, cholecystectomy, prostate  surgery.  He denies history of pneumonia and has had pneumococcal  vaccine.  He says neither Advair nor Spiriva had any effect on his  shortness of breath.   SOCIAL HISTORY:  He quit smoking 50 years ago.  Widowed.  Has a  girlfriend.  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   FAMILY HISTORY:  Brother with emphysema.  Mother and brother with heart  disease.   OBJECTIVE:  Weight 211 pounds, BP 160/82, pulse 57, room air saturation  95%.  He  is alert.  Palate spacing is unremarkable, 2/4.  Dentures.  Voice quality normal.  No stridor, neck vein distension, thyromegaly or adenopathy found.  Heart sounds are regular.  A question of  trace systolic murmur at the  aortic space.  The lung fields are clear.  I do not hear rales, rhonchi, or wheeze, and  work of breathing is not labored.  EXTREMITIES:  No cyanosis, clubbing, or edema.   IMPRESSION:  1. Obstructive sleep apnea with an index of 21.6 per hour on June 25, 2006.  It is not yet clear why he is waking when wearing CPAP and I      am not sure that associated discomfort is yet completely excluded.      We also want to confirm pressure settings.  2. Hypoxia at night most commonly would be from cardiopulmonary      disease or inadequate control with CPAP.  We need to better define      his daytime pulmonary status and reevaluate best pressure for CPAP.   PLAN:  1. We are scheduling pulmonary function testing with 6-minute walk      test.  2. We are going to auto-titrate him for pressure check on his CPAP.  3.  Return in 1 month, earlier p.r.n.  I appreciate the chance to meet      him.     Clinton D. Maple Hudson, MD, Tonny Bollman, FACP  Electronically Signed    CDY/MedQ  DD: 11/05/2006  DT: 11/06/2006  Job #: [id number]   cc:   Onalee Hua L. Annalee Genta, M.D.  Geoffry Paradise, M.D.  Colleen Can. Deborah Chalk, M.D.

## 2010-08-27 NOTE — Assessment & Plan Note (Signed)
[facility]                             PULMONARY OFFICE NOTE   [name] [name]                    MRN:          [id number]  DATE:12/01/2006                            DOB:          Aug 16, 1920    PROBLEM:  1. Obstructive sleep apnea.  2. COPD with hypoxia.   HISTORY:  He started out by saying he dislikes CPAP, and then admitted  he falls asleep fine with it, wakes briefly around 1:30, and then is  back asleep until 3:30 or 4 at which time he wakes and stays awake. He  can not tell that it is CPAP that is waking him. His girlfriend tells  him that he snores through his mask. Pressure had been set at 9 CWP by  titration. He now has an auto titration machine just delivered and we  are going to wait to see how he does with that. We discussed insomnia  and waking after sleep onset. He previously had quit oxygen at night  because he said he could not tell it made any difference. He had also  quit Advair and Spiriva saying they did not seem to help.   MEDICATIONS:  1. Omeprazole 20 mg.  2. Felodipine 10 mg.  3. Hydrochlorothiazide 25 mg.  4. Atenolol 50 mg.  5. Simvastatin 80 mg.  6. CPAP.  7. Home oxygen which he discontinued.  8. Aspirin.   No medication allergy.   OBJECTIVE:  Weight 211 pounds, blood pressure 130/72, pulse 72, room air  saturation 95%.  Some pursed lip breathing. Crackles in the lower lung zones.  No edema.  Pulse regular with a grade 1/6 systolic murmur at the aortic space.   PFT: Normal lung volumes by nitrogen washout, but moderately severe  obstructive airways disease with an FEV 1 of 1.4 liters (56% predicted),  FEV 1/FVC ratio 0.46. Some response to bronchodilator. He was unable to  performed diffusion studies. On a 6 minute walk test. He stopped after 4  minutes at 240 meters. Heart rate rose from 72 to 96 and 2 minutes later  was back to 72. Oxygen saturation fell from 92% at baseline to 85% at  the end of  the 4 minutes he walked, and rebounded to 95% with rest,  interpreted as significant exertionally hypoxia.   IMPRESSION:  1. Obstructive sleep apnea. I do not think the CPAP was the main      problem except that it does not sound as if it was controlling his      snoring. His waking pattern sounds more like an insomnia. Since he      now has auto titration we are going to watch for that.  2. Chronic obstructive pulmonary disease with exertional hypoxia and      previously defined sleep related hypoxia. Plan for this is to get a      chest x-ray today, try Symbicort 160/4.5 two puffs b.i.d., and try      Temazepam 15 mg 1 or 2 at bedtime p.r.n. Schedule return 1 month,      earlier p.r.n.  [name] D. Maple Hudson, MD, [name] [name], FACP  Electronically Signed    CDY/MedQ  DD: 12/01/2006  DT: 12/01/2006  Job #: [id number]   cc:   Onalee Hua L. Annalee Genta, M.D.  Colleen Can. Deborah Chalk, M.D.  Geoffry Paradise, MD

## 2010-08-27 NOTE — Assessment & Plan Note (Signed)
[facility]                             PULMONARY OFFICE NOTE   ANDROS, CHANNING                    MRN:          [id number]  DATE:12/31/2006                            DOB:          02-17-1921    PROBLEMS:  1. Obstructive sleep apnea.  2. COPD with hypoxia.   HISTORY:  He complains of exertional dyspnea, saying he cannot walk more  than 50 feet without stopping.  No pain and this is stable.  He always  feels wet throat.  He is not using CPAP and has decided he wants to go  back to Dr. Annalee Genta and discuss again surgeries for sleep apnea.  He  has been told he snores through the auto-titration machine and agrees to  let me get it set to a fixed pressure of 10 for trial.  He denies  reflux, postnasal drainage, but complains he stays hoarse.  He very much  likes temazepam, using 15 mg twice.  We discussed this.  He had  previously failed to benefit from Spiriva or Advair.   MEDICATIONS:  1. Omeprazole 20 mg.  2. Felodipine 10 mg.  3. Hydrochlorothiazide 25 mg.  4. Atenolol 50 mg.  5. Simvastatin 80 mg.  6. Aspirin 81 mg.  7. Symbicort 160/4.5 two puffs b.i.d.  8. Temazepam 15 mg one or two at h.s.   No medication allergy.   OBJECTIVE:  Weight 209 pounds, BP 120/76, pulse 76, room air saturation  92%.  Mild hoarseness.  No pharyngeal erythema or visible drainage, no  stridor.  He is not in particular distress.  Chest is quiet without  rales or wheeze.  Pulse regular.  I do not hear a murmur.   Pulmonary function tests on August 19 showed moderate obstructive  airways disease with response to bronchodilator.  Normal lung volumes.  He was unable to perform the diffusion capacity maneuver.  FEV1 was 1.40  (56% of predicted).  On a 6-minute walk test he went 240 meters,  desaturating to 85% by the end of the test and rebounding 2 minutes  later to 95% on room air.  Oxygen desaturation appeared to be the  limiting issue for his test.   IMPRESSION:  1. Obstructive sleep apnea.  He is not comfortable with CPAP but is      willing to try once more at fixed pressure of 10.  He still wants      to get a surgical opinion from Dr. Annalee Genta.  2. Chronic obstructive pulmonary disease with exertional desaturation.      He may become a candidate for home oxygen.   PLAN:  1. He is referred to the [facility] pulmonary rehabilitation program.  2. Change CPAP to fixed pressure 10 CWP.  3. He will make a return appointment with Dr. Annalee Genta for surgical      opinion about sleep apnea and also for evaluation of his complaint      of hoarseness.  Schedule back to me 4 months, earlier p.r.n.     Clinton D. Maple Hudson, MD, FCCP, FACP  Electronically Signed    CDY/MedQ  DD: 12/31/2006  DT: 01/01/2007  Job #: [id number]   cc:   Geoffry Paradise, M.D.  Kinnie Scales. Annalee Genta, M.D.  Colleen Can. Deborah Chalk, M.D.

## 2010-08-30 NOTE — Op Note (Signed)
[facility]  Patient:    KAPONO, LUHN Visit Number: [id number] MRN: [id number]          Service Type: SUR Location: 3W 0375 02 Attending Physician:  Tania Ade Proc. Date: 12/04/00 Adm. Date:  12/04/2000                             Operative Report  DIAGNOSIS:  Organic impotence.  OPERATIVE PROCEDURE:  Placement of three-piece Mentor penile prosthesis with infrapubic approach.  SURGEON:  Lucrezia Starch. Ovidio Hanger, M.D.  ASSISTANT:  Veverly Fells. Vernie Ammons, M.D.  ANESTHESIA:  General endotracheal.  ESTIMATED BLOOD LOSS:  50 cc.  TUBES:  16 French coude catheter.  COMPLICATIONS:  None.  16 cm cylinders are utilized with 3 cm RTEs bilaterally.  INDICATION FOR PROCEDURE:  Mr. Sherrow is a very nice 77 year old white male, who is status post radical retropubic prostatectomy in the past for prostate cancer and radiation therapy.  He has organic impotence, and he has lost his wife and would to consider options.  He has tried other options that have not really worked well and after understanding risks, benefits, and alternatives, he has elected to proceed with placement of penile prosthesis.  PROCEDURE IN DETAIL:  The patient was placed in the supine position after proper general endotracheal anesthesia, was prepped and draped with Betadine in a sterile fashion.  A suprapubic incision was then made vertically and extended to the infrapubic area, and dissection was carried out.  The rectus fascia was incised approximately 1 cm.  The rectus muscle bellies were retracted laterally, and dissection was carried in the retropubic space to create a pouch for the reservoir.  Next, the dissection was carried distally. The corpora were dissected out.  Horizontal corporotomies were performed after 2-0 PDS sutures had been placed proximally and distally, and the corpora were dilated with Hagar dilators to 13 French bilaterally.  Measurements were  taken and were noted to be approximately 10 cm distally and 9 cm proximally, for a total of 19 cm.  Next, the subdartos pouch was created in the scrotum, and the reservoir was placed, was inflated, and noted to have no back tension, and the fascia was closed around it with a running 2-0 PDS suture.  Antibiotic solution was used throughout the procedure.  Utilizing the Foley device, the penile prosthesis was then placed and seated in position distally.  Three centimeter RTEs were placed bilaterally and again seated in position.  It was inflated and noted to be in good position.  It was then deflated, and the corporotomies were closed with a running 2-0 PDS suture, and the pump was placed into the scrotal sac and noted to be in good position.  The proper connections were made with quick-connects.  The device was inflated and deflated and noted to be functioning properly.  Good hemostasis was noted to be present.  Thorough irrigation was performed.  The tubing was tucked under the Scarpas fascia, and Scarpas fascia was closed with interrupted 3-0 chromic catgut.  The skin was closed with skin staples.  The Foley catheter was maintained in place.  The device was deflated with just a small amount left in the cylinders, and the patient was taken to the recovery room in stable condition. Attending Physician:  Tania Ade DD:  12/04/00 TD:  12/05/00 Job: [id number]

## 2010-08-30 NOTE — Procedures (Signed)
NAME:  [name] [name], [name] [name] NO.:  [id number]   MEDICAL RECORD NO.:  [id number]          PATIENT TYPE:  OUT   LOCATION:  SLEEP CENTER                 FACILITY[facility]  [facility]   PHYSICIAN:  Clinton D. Maple Hudson, MD, FCCP, FACPDATE OF BIRTH:  08-Jul-1922   DATE OF STUDY:                            NOCTURNAL POLYSOMNOGRAM   INDICATION FOR STUDY:  Hypersomnia with sleep apnea.   EPWORTH SLEEPINESS SCORE:  5/24.   BMI 27.7, weight 205 pounds.   HOME MEDICATIONS:  Listed and reviewed.   SLEEP ARCHITECTURE:  Total sleep time 269 minutes, with sleep efficiency  61%.  Stage I was 21%, stage II 57%.  Stages III and IV were absent.  REM 22% of total sleep time.  Sleep latency 54 minutes, REM latency 59  minutes, awake after sleep onset 117 minutes, arousal index 31.4.  No  bedtime medication was taken.   RESPIRATORY DATA:  Split study protocol.  Apnea-hypopnea index (AHI,  RDI) 21.6 obstructive events per hour, indicating moderate obstructive  sleep apnea/hypopnea syndrome before CPAP.  This included 36 obstructive  apneas and 8 hypopneas before CPAP.  Events were position, more common  while supine.  REM AHI 2 per hour.  CPAP was titrated to 15 CWP, AHI 0  per hour.  A medium ResMed Ultra Mirage full face mask was used with  heated humidifier.   OXYGEN DATA:  Moderate to loud snoring, with oxygen desaturation to a  nadir of 78% before CPAP.  After CPAP control, saturation held 88%-90%  on room air, suggesting possible presence of underlying cardiopulmonary  disease.   CARDIAC DATA:  Sinus rhythm, with occasional PAC and PVC.   MOVEMENT/PARASOMNIA:  Occasional limb jerk, insignificant.   IMPRESSIONS/RECOMMENDATIONS:  1. Moderate obstructive sleep apnea/hypopnea syndrome, AHI 21.6 per      hour, with events most common while supine.  Moderately loud      snoring, with oxygen desaturation to a nadir of 78%.  2. Successful CPAP titration to 15 CWP, AHI 0 per hour.  A Medium  ResMed Ultra Mirage full face mask was used, with heated      humidifier.  3. Note that even after successful CPAP titration, oxygenation was      marginal at 88%-90% on room air.  This may      reflect underlying cardiopulmonary disease.  Consider overnight      oximetry while the patient is wearing CPAP at home, to determine      whether home oxygen might be needed.      Clinton D. Maple Hudson, MD, FCCP, FACP  Diplomate, Biomedical engineer of Sleep Medicine  Electronically Signed     CDY/MEDQ  D:  06/28/2006 14:40:14  T:  06/29/2006 09:33:31  Job:  [id number]

## 2010-08-30 NOTE — Op Note (Signed)
[facility]  Patient:    [name] [name], [name] [name]                    MRN: [id number] Proc. Date: 07/15/00 Adm. Date:  [id number] Disc. Date: [id number] Attending:  Beatris Ship                           Operative Report  PREOPERATIVE DIAGNOSIS:  Cholecystitis.  POSTOPERATIVE DIAGNOSIS:  Cholecystitis.  PROCEDURE:  Laparoscopic cholecystectomy.  SURGEON:  Ollen Gross. Vernell Morgans, M.D.  ASSISTANT:  Catalina Lunger, M.D.  ANESTHESIA:  General endotracheal.  DESCRIPTION OF PROCEDURE:  After informed consent was obtained, the patient was brought to the operating room and placed in a supine position on the operating room table.  After adequate induction of general anesthesia, the patients abdomen was prepped with Betadine and draped in the usual sterile manner.  A small transverse supraumbilical incision was made after infiltrating this area with 0.25% Marcaine.  The incision was carried down through the subcutaneous tissue using blunt dissection with a Kelly clamp and Army-Nav[employer] retractors until the linea alba was identified.  The linea alba was incised with a #15 blade knife, and each side was grasped between Kocher clamps and elevated.  The preperitoneal space was probed bluntly with a hemostat until the peritoneum was opened and access was gained to the abdominal cavity.  A finger was inserted through this hole, and there were no apparent adhesions to the anterior abdominal wall found.  A 0 Vicryl pursestring suture was then placed in the fascia around this hole.  The Hasson cannula was inserted into the abdomen and anchored in place with the previously placed Vicryl pursestring stitch.  The abdomen was then insufflated with carbon dioxide, and the laparoscope was placed through the Hasson cannula.  The edge of the liver and dome of the gallbladder were readily identifiable.  A small transverse upper midline incision was made with the #15 blade knife  after infiltrating this area with 0.25% Marcaine.  A 10 mm port was then placed through this incision into the abdominal cavity bluntly under direct vision.  Two placed were then chosen laterally in the right side of the abdomen for 5 mm ports below the costal margin.  These areas were infiltrated with 0.25% Marcaine, and small incisions were made with the #15 blade knives. The 5 mm ports were then placed through these incisions bluntly into the abdominal cavity under direct vision.  A blunt grasper was placed through the lateral-most 5 mm port and used to grasp the dome of the gallbladder and elevate it anteriorly and superiorly.  Another blunt grasper was placed through the other 5 mm port and used to retract on the body and neck of the gallbladder, and a [place] resector was placed through the upper midline port and used to bluntly dissect adhesions from the body of the gallbladder.  The peritoneal reflection at the area of the gallbladder neck was opened bluntly and blunt dissection was carried out in this area until the gallbladder neck cystic duct junction was identified, and this area was cleared with blunt dissection until it could be identified circumferentially.  Care was taken to keep the common duct medial to this dissection.  A single clip was placed very proximally on the gallbladder neck cystic duct junction, and a small nick was made in the cystic duct on the cystic duct side of the clip.  Flow of  bile was readily apparent from this hole.  A 14 gauge Angiocath was then placed percutaneously through the anterior abdominal wall under direct vision, and a Reddick catheter was placed through this.  The catheter was guided into the cystic duct and anchored in place with a clip.  An intraoperative cholangiogram was then obtained which showed a very dilated common duct system but no stones and very rapid emptying of the duct into the duodenum.  The patient was then repositioned as  he was before, and the clip anchoring the Reddick catheter was removed.  The Reddick catheter was also removed, and three clips were placed proximally on the cystic duct, and the cystic duct was divided between the two sets of clips.  The cystic artery was identified posterior to this and dissected again in a circumferential manner.  Two clips were placed proximally and one distally on the cystic artery, and the artery was divided between the two.  Using the hook electrocautery, the gallbladder was then removed from the liver bed.  The gallbladder appeared to be fairly intrahepatic and prior to completely removing the gallbladder from the liver bed, the liver bed was inspected, and several bleeding points required coagulation with the Bovie electrocautery.  The gallbladder was then removed the rest of the way from the liver bed.  The laparoscope was then moved to the upper midline port, and an endoscopic bag was placed through the Hasson cannula.  The gallbladder was placed within the endoscopic bag, and the opening in the bag was closed.  The gallbladder and endoscopic bag were then removed with the Hasson cannula through the supraumbilical port.  The Hasson cannula was then replaced, and the abdomen was inspected again.  The liver bed seemed to be hemostatic.  The abdomen was then irrigated with copious amounts of saline until the effluent was clear.  The ports were then all removed under direct vision and were found to be hemostatic.  The fascia at the supraumbilical port was closed with the previously placed Vicryl pursestring stitch.  The skin incisions were then all closed with 4-0 Monocryl subcuticular stitches.  Benzoin and Steri-Strips were applied.  The patient tolerated the procedure well.  At the end of the case, all sponge, needle and instrument counts were correct.  The patient was awakened and taken to the recovery room in stable condition. DD:  07/22/00 TD:  07/22/00 Job:  [id number]

## 2010-08-30 NOTE — Discharge Summary (Signed)
[facility]  Patient:    [name] [name], [name] [name] Visit Number: [id number] MRN: [id number]          Service Type: SUR Location: 3W [id number] 02 Attending Physician:  Tania Ade Dictated by:   Lucrezia Starch. Ovidio Hanger, M.D. Admit Date:  12/04/2000 Discharge Date: 12/05/2000                             Discharge Summary  DIAGNOSIS:  Organic impotence.  OPERATIVE PROCEDURE:  Placement of penile prosthesis on 12/04/00.  HISTORY OF PRESENT ILLNESS:  The patient is a very nice 77 year old white male who is status post radical retropubic prostatectomy in the distant past.  He has also undergone radiation therapy.  He has organic impotence.  His wife has died of cancer and he is now dating and would like to consider other options. He has tried Viagra, prostaglandin injections, vacuum erection devices and other options, and has really not been satisfied.  He has considered all options and elected to proceed with placement of penile prosthesis.  PAST MEDICAL HISTORY, SOCIAL HISTORY, FAMILY HISTORY AND REVIEW OF SYSTEMS: Please see the history and physical for full details.  PHYSICAL EXAMINATION:  VITAL SIGNS:  Afebrile.  Vital signs are stable.  GENERAL:  Well-nourished, well-developed and in no acute distress.  HEENT:  PERRLA.  Ear, nose and throat clear.  NECK:  Without masses or thyromegaly.  CHEST:  Clear anteriorly and posteriorly without rales or rhonchi.  HEART:  Normal sinus rhythm without murmurs or gallops.  ABDOMEN:  Soft and nontender without masses or organomegaly.  EXTREMITIES:  Normal.  NEUROLOGIC:  Intact.  GENITOURINARY:  The penis, meatus, scrotum, testicles, adnexa, anus and perineum were normal.  RECTAL:  Vault is empty.  The prostate is not palpable.  HOSPITAL COURSE:  The patient was admitted after undergoing proper preoperative evaluation.  He was taken to surgery on 12/04/00 and underwent placement of a three-piece Mentor  penile prosthesis uneventfully. Postoperatively, he initially did well.  He had some ecchymosis and swelling, as to be expected.  By postoperative day #1 (12/05/00), the Foley catheter was removed.  He voided well.  He was tolerating a diet.  The wound was clean and dry.  He had moderate swelling.  He was discharged to home on pain medications and Cipro.  CONDITION ON DISCHARGE:  Improved.  DISPOSITION:  Instructions were given.  He was to follow up the following week for staple removal. Dictated by:   Lucrezia Starch. Ovidio Hanger, M.D. Attending Physician:  Tania Ade DD:  12/12/00 TD:  12/12/00 Job: [id number]

## 2010-08-30 NOTE — Discharge Summary (Signed)
[place]  Patient:    MANDELL, PANGBORN                    MRN: [id number] Adm. Date:  [id number] Disc. Date: 07/15/00 Attending:  Beatris Ship                           Discharge Summary  PRIMARY CARE PHYSICIAN:  Dr. Geoffry Paradise.  CARDIOLOGIST:  Dr. Delfin Edis.  CONSULTING PHYSICIANS:  Dr. Demetrius Charity. Carolynne Edouard of surgery and Dr. Madilyn Fireman of gastroenterology.  DISCHARGE DIAGNOSES: 1. Escherichia coli sepsis from biliary source. 2. Cholelithiasis. 3. Atherosclerotic coronary artery disease with past history of three-vessel    bypass. 4. History of bovine prosthetic aortic valve replacement in 2000. 5. History of osteoarthritis. 6. Past history of prostate cancer. 7. Hypertension. 8. History of bilateral knee replacements. 9. Hyperlipidemia.  PROCEDURES: 1. Abdominal ultrasound. 2. CT scan of the abdomen. 3. Laparoscopic cholecystectomy.  DISCHARGE MEDICATIONS:  Mr. Saiki is to continue his home medications including: 1. Pravachol 20 mg q.h.s. 2. Prevacid 30 mg q.d. 3. Norvasc 10 mg q.d. 4. Baby aspirin q.d. 5. Amitriptyline 50 mg q.h.s. 6. Accupril 10 mg q.d. 7. Toprol XL 50 mg q.d.  In addition, he is to have: 1. Vicodin one to two p.o. q.4-6h. p.r.n. pain. 2. Ciprofloxacin 500 mg b.i.d. to continue for six further days.  HISTORY OF PRESENT ILLNESS:  Mr. Wulf is a 77 year old male who presented with fevers to 103, jaundice, and severe lethargy.  In the emergency department, he was found to have an obstructive picture of elevated liver enzymes.  Furthermore, blood cultures eventually grew E. coli on multiple blood cultures, and one culture grew Staph epidermidis.  Through his emergency room workup, he had an abdominal ultrasound which did not show any significant abnormalities.  CT scan of the abdomen showed only small gallstones.  Given this picture, the patient was admitted for further management and placed on  IV antibiotics.  HOSPITAL COURSE:  Mr. Gossman improved rapidly with intravenous Zosyn therapy. He defervesced, and liver enzymes began to improve; however, given this picture, gastroenterology was consulted.  It was felt that he likely had a biliary source of his E. coli sepsis.  Therefore, surgery consultation for possible cholecystectomy was recommended.  This was deemed appropriate, and on July 14, 2000, the patient underwent laparoscopic cholecystectomy.  Prior to surgery, cardiology was consulted and cleared the patient for the OR.  He tolerated the procedure very well and had no complications.  On July 15, 2000, he was discharged home in stable condition.  DISCHARGE LABORATORY DATA:  White blood count 9.1 with 88% segs, 5% lymphocytes, 5% monocytes, hemoglobin 13.6, platelet count 276,000.  Sodium 137, potassium 4.2, chloride 103, CO2 26, BUN 11, creatinine 0.8, glucose mildly elevated at 151, total bilirubin 1.3, alk phos 140, AST 85, ALT 104, albumin 2.6, protein 5.9.  PHYSICAL EXAMINATION AT DISCHARGE:  GENERAL APPEARANCE:  He was in no acute distress.  He was walking in the room with no difficulty.  VITAL SIGNS:  The patient was afebrile with entirely stable vital signs and was normotensive.  SKIN:  His laparoscopic wounds were clean and dry.  CHEST:  Clear to auscultation.  CARDIOVASCULAR:  Heart was regular with a 3/6 murmur right sternal border.  ABDOMEN:  Abdomen was soft, and he had no significant edema.  NEUROLOGIC:  He was alert and oriented x 4 with no  neuro deficits.  DISCHARGE INSTRUCTIONS:  Mr. Fichera is to have no diet restrictions.  He is to do no heavy lifting until cleared by his surgeon.  He may shower on Thursday which is in one day.  DISCHARGE FOLLOWUP:  He is to follow up in two months with Dr. Jacky Kindle and in two weeks with Dr. Carolynne Edouard and per his regular visit with Dr. Deborah Chalk.  He is to call if he has any further problems. DD:  07/15/00 TD:   07/15/00 Job: [id number]

## 2010-08-30 NOTE — H&P (Signed)
[place]  Patient:    [name] [name], [name] [name]                    MRN: [id number] Adm. Date:  [id number] Attending:  Beatris Ship CC:         [name] [name], M.D.   History and Physical  CHIEF COMPLAINT:  Fever, weakness.  HISTORY OF PRESENT ILLNESS:  [name] [name] is a 77 year old male with a past history significant for aortic valve replacement with a bovine valve in 2000, as well as hypertension and hyperlipidemia, and three-vessel bypass at the time of his aortic valve replacement.  The patient noted on Monday, while playing golf, that he felt weak and did not feel well.  He was a little short of breath and developed high fevers to 103 degrees.  He was achy and weak.  He presented to the emergency room on July 06, 2000, where he was treated with IV fluids and felt to have a viral syndrome.  He was discharged home.  He continued to feel poorly and had hard chill, with some nausea and no vomiting last night.  There has been no blood noted from above or below, but his urine became very dark.  He also was significantly anorexic.  Given his continued fevers and problems, he presented to the emergency room; was found to have fever to 101 and an obstructive picture of elevated liver enzymes.  Blood and urine cultures were sent, and he was started empirically on broad spectrum antibiotics.  He was admitted for further evaluation and treatment.  PAST MEDICAL HISTORY: 1. Bovine aortic valve replacement in 2000, with a three-vessel coronary    artery bypass graft. 2. Hyperlipidemia. 3. Hypertension. 4. No history of diabetes or stroke. 5. History of bilateral knee replacements. 6. History of prostatectomy for prostate cancer in 1993.  ALLERGIES:  No known drug allergies.  MEDICATIONS:  In the emergency room he received Tylenol, normal saline and Rocephin x 1.  HOME MEDICATIONS: 1. Pravachol 20 mg q.h.s. 2. Prevacid 30 mg q.d. 3.  Norvasc 10 mg q.d. 4. Baby aspirin q.d. 5. Amitriptyline 50 mg q.h.s. 6. Accupril 10 mg q.d. 7. Toprol 50 mg q.d. 8. Ibuprofen 800 mg q.6h. for the last two to three days.  SOCIAL HISTORY:  He has been a widow since August 2001.  He has two sons.  He quit smoking 30 years ago.  He does drink moderate alcohol, about 18-20 drinks a week.  He denies any illicit drug use.  FAMILY HISTORY:  Noncontributory.  PHYSICAL EXAMINATION:  VITAL SIGNS:  Temperature 101.3 rectally on admission; at the time I saw him it was 100.5.  Pulse 97, blood pressure 92/50, respiratory rate 24, 94% saturation on room air.  GENERAL:  He is lying supine with icterus noted.  He is no acute distress.  SKIN:  Warm.  NECK:  No JVD.  CHEST:  Clear to auscultation bilaterally.  HEART:  Regular with a 2-3/6 murmur at the right sternal border.  ABDOMEN:  Soft, nontender, nondistended; with no mass or hepatosplenomegaly. There was definitely no tenderness elicited.  EXTREMITIES:  No edema.  NEUROLOGIC:  Alert and oriented x 4.  LABORATORY DATA:  EKG shows normal sinus rhythm with premature atrial complexes and left ventricular hypertrophy, as well as a possible first-degree AV block.  There were no significant ST changes.  Chest x-ray was stable, with no evidence of any acute disease.  Evidence of bypass and  aortic valve replacement and possible mild COPD changes are present.  White blood count 8.4, with 97% segs, 3% lymphocytes; hemoglobin 14.0, platelet count 113,000.  Sodium 137, potassium 3.0, chloride 110, CO2 22, BUN 17, creatinine 1.2, glucose 137.  Calcium 8.5, protein 6.4, albumin slightly low at 2.9, AST elevated at 136, ALT 247, alkaline phosphatase 150, total bilirubin 6.2.  Lipase normal at 16.  ASSESSMENT AND PLAN:  A 77 year old male with obstructed picture of liver enzyme elevation in the setting of fevers and chills.  We will hold his Pravachol. Will obtain CT scan of the abdomen to  rule out gallbladder disease. Will hydrate intravenously and give antipyretics.  We will also cover empirically for possible ascending cholangitis.  The patient may need surgery or GI consultation.  Furthermore, we will follow CK and Troponin enzymes and also try to rule out rhabdomyolysis.  We will treat with subcutaneous heparin for DVT prophylaxis. DD:  07/09/00 TD:  07/09/00 Job: [id number]

## 2010-08-30 NOTE — Procedures (Signed)
[facility]  Patient:    [name] [name], [name] [name]                    MRN: [id number] Proc. Date: 10/26/00 Adm. Date:  [id number] Attending:  Louie Bun                           Procedure Report  PROCEDURE PERFORMED:  Colonoscopy.  ENDOSCOPIST:  Everardo All. Madilyn Fireman, M.D.  INDICATIONS FOR PROCEDURE:  History of adenomatous colon polyps.  DESCRIPTION OF PROCEDURE:  The patient was placed in the left lateral decubitus position and placed on the pulse monitor with continuous low flow oxygen delivered by nasal cannula.  He was sedated with 70 mg IV Demerol and 7 mg IV Versed.  The Olympus video colonoscope was inserted into the rectum and advanced to the cecum, confirmed by transillumination of McBurneys point and visualization of the ileocecal valve and appendiceal orifice.  The prep was good.  The cecum, ascending, transverse and descending colon appeared normal with no masses, polyps, diverticula or other mucosal abnormalities. Within the sigmoid colon there were seen a few scattered diverticula, no other abnormalities.  The rectum appeared normal.  Retroflex view of the anus revealed no obvious internal hemorrhoids.  The colonoscope was then withdrawn and the patient returned to the recovery room in stable condition.  The patient tolerated the procedure well.  There were no immediate complications.  IMPRESSION:  Sigmoid diverticulosis.  Otherwise normal colonoscopy.  PLAN:  Repeat colonoscopy in five years. DD:  10/26/00 TD:  10/26/00 Job: [id number]

## 2010-08-30 NOTE — Consult Note (Signed)
[name]  Patient:    [name] [name], [name] [name]                    MRN: [id number] Proc. Date: 07/13/00 Adm. Date:  [id number] Attending:  Beatris Ship CC:         Rodrigo Ran, M.D.  Ollen Gross. Vernell Morgans, M.D.  John C. Madilyn Fireman, M.D.   Consultation Report  HISTORY OF PRESENT ILLNESS:  Thank you for asking me to see this pleasant 77 year old widowed Caucasian male, who was admitted because of fever and evidence of obstructive jaundice.  This man is generally followed by Dr. Deborah Chalk for his heart problems.  He was last seen in our office on April 08, 2000.  In March 2000, the patient underwent aortic valve replacement with a bioprosthetic Carpentier-Edwards valve and also underwent coronary artery bypass graft x 3.  He underwent a left internal mammary artery to the LAD, saphenous vein graft to the ramus intermedius, and a saphenous vein graft to the obtuse marginal.  His cardiac surgeon is Dr. Donata Clay.  The patient had previously presented in acute pulmonary edema and was found to have critically tight aortic stenosis as well as significant coronary artery disease.  His hospital course at that time was complicated by atrial fibrillation with a rapid ventricular response.  The patient did well postoperatively and subsequent to hospital discharge has done exceedingly well from the cardiac standpoint.  His cardiac medications include Norvasc 10 mg daily, baby aspirin daily, Toprol XL 50 mg daily, Accupril 20 mg daily, Pravachol 20 mg daily.  He has also been on Prevacid and various anti-inflammatories for arthritis.  the patient has not been experiencing any angina pectoris.  He has not had any symptoms to suggest congestive heart failure.  He is not having any arrhythmias or palpitations.  He has had no dizziness or syncope.  PAST MEDICAL HISTORY:  His past medical history reveals that he has had significant osteoarthritis and has seen Dr. Joya Martyr  group.  He is able to play golf, but he is not able to walk very far because of the low back pain.  Past medical history also includes a history of prostate cancer and hypertension. He has had bilateral knee replacements.  SOCIAL HISTORY:  He is a moderate drinker.  He drinks about six beers a night and has several glasses of wine per week.  He quit smoking about 30 years ago. Social history reveals that he has been widowed since August 2001.  FAMILY HISTORY:  Noncontributory.  PHYSICAL EXAMINATION:  VITAL SIGNS:  His blood pressure tonight is 132/70, pulse 72, regular, temperature 100.  GENERAL:  Color is good.  SKIN:  Warm and dry.  HEENT:  Negative.  NECK:  Carotids:  Normal upstroke.  He does have a murmur transmitted from the heart audible in both carotids.  The jugular venous pressure is normal.  The thyroid is normal.  CHEST:  Clear to percussion and auscultation.  CARDIAC:  The heart revealed a grade 2/6 harsh systolic ejection murmur over the aortic area, radiated to the neck.  There is no aortic insufficiency. There is no S3 or S4 gallop.  There is no mitral murmur, and there is no pericardial rub.  ABDOMEN:  Soft and nontender.  Liver and spleen are not enlarged or tender. There is no abdominal mass.  EXTREMITIES:  Good peripheral pulses and no phlebitis or edema.  LABORATORY DATA:  His electrocardiogram shows normal sinus rhythm  with occasional PAC and no ischemic changes.  Chest x-ray shows normal heart size, no CHF, and evidence of COPD.  There is no active disease.  IMPRESSION: 1. Normal bioprosthetic aortic valve function. 2. No evidence for active ischemic heart disease at this time, and the patient    is doing well post bypass graft surgery and aortic valve replacement.  RECOMMENDATIONS:  Recommend that we proceed with gallbladder surgery in the morning as planned.  He will be covered with antibiotics in the perioperative period to prevent against  subacute bacterial endocarditis on his prosthetic valve. DD:  07/13/00 TD:  07/14/00 Job: [id number]

## 2010-08-30 NOTE — Consult Note (Signed)
[facility]  Patient:    [name] [name], [name] [name]                    MRN: [id number] Proc. Date: 07/12/00 Adm. Date:  [id number] Attending:  Beatris Ship                          Consultation Report  HISTORY:  [name] [name] is a 77 year old, white male who initially began feeling poorly on Monday of this week.  He developed generalized malaise, dizziness, fevers and chills.  He was brought to the emergency department where he was initially diagnosed with a viral syndrome and sent home.  He then returned to the emergency department with worsening symptoms.  At that time he was found to have very elevated liver functions and a fever of 103.  He was felt at that time to have cholangitis and blood cultures have grown out resistant E. coli. At that time his bilirubin was 6.2.  His alk phos was 150.  SGOT was 67,  SGPT was 147.  Since that time his liver functions have been normalizing.  He has never had any abdominal pain.  He has felt nauseated once or twice, but not vomited.  He does have some problems with incontinence of both stool and urine since a previous prostate resection.  He denies any chest pains or shortness of breath.  REVIEW OF SYSTEMS:  His other review of systems is unremarkable.  PAST MEDICAL HISTORY:  Significant for coronary artery disease and aortic valvular disease, hyperlipidemia, hypertension, prostate cancer, and degenerative joint disease.  Hypertension.  PAST SURGICAL HISTORY:  Bovine aortic valve replacement and 3-vessel coronary artery bypass grafting in 2000.  Prostatectomy and bilateral knee replacements.  MEDICATIONS:  1. Pravachol 20 mg p.o. q.d.  2. Prevacid 30 mg p.o. q.d.  3. Norvasc 10 mg p.o. q.d.  4. Baby aspirin 1 q.d.  5. Amitriptyline 50 mg p.o. q.d.  6. Accupril 10 mg p.o. q.d.  7. Toprol 50 mg p.o. q.d.  8. Ibuprofen.  SOCIAL HISTORY:  He quit smoking about 30 years ago and drinks about 18-20 drinks  of alcohol a week.  FAMILY HISTORY:  Noncontributory.  PHYSICAL EXAMINATION:  GENERAL:  He is a well-developed, well-nourished, white male in no acute distress lying in bed.  SKIN:  Warm and dry with no jaundice.  HEENT:  Extraocular muscles are intact.  Pupils equal, round, and react to light.  NECK:  No bruits.  LUNGS:  Clear to auscultation bilaterally.  HEART:  Regular rate and rhythm with a 3/6 systolic murmur.  ABDOMEN:  Soft and nontender with a well-healed lower midline scar.  EXTREMITIES:  No clubbing, cyanosis or edema, with good peripheral pulses.  NEUROLOGIC:  He is alert and oriented x 4.  HEMATOLOGIC:  I can palpate no lymphadenopathy.  ASSESSMENT AND PLAN:  This is a 77 year old gentleman who initially presented with a picture that may have been consistent with cholangitis.  He was started on broad-spectrum antibiotics and has improved since then.  He has radiographic evidence of stones in his gallbladder and it appears that he may have passed a common duct stone during this period of what appeared to be cholangitis.  I suspect that he would benefit from cholecystectomy.  If he has any further bumps in his liver functions prior to surgery, I think he would benefit from a preoperative endoscopic retrograde cholangiopancreatography. Otherwise we can go  ahead and plan for possible laparoscopic cholecystectomy. We would prefer to know from his medical doctors and cardiologist that he is clear from an overall medical and cardiac standpoint to undergo surgery.  We will await cardiology and medicines recommendations, and if okay will plan for surgery sometime early this week. DD:  07/12/00 TD:  07/12/00 Job: [id number]

## 2010-12-04 ENCOUNTER — Encounter: Payer: Self-pay | Admitting: Internal Medicine

## 2010-12-05 ENCOUNTER — Ambulatory Visit (INDEPENDENT_AMBULATORY_CARE_PROVIDER_SITE_OTHER): Payer: Medicare Other | Admitting: Internal Medicine

## 2010-12-05 ENCOUNTER — Encounter: Payer: Self-pay | Admitting: Internal Medicine

## 2010-12-05 VITALS — BP 138/72 | HR 64 | Ht 72.0 in | Wt 181.8 lb

## 2010-12-05 DIAGNOSIS — G4733 Obstructive sleep apnea (adult) (pediatric): Secondary | ICD-10-CM

## 2010-12-05 DIAGNOSIS — J449 Chronic obstructive pulmonary disease, unspecified: Secondary | ICD-10-CM

## 2010-12-05 NOTE — Patient Instructions (Signed)
Order- [facility]- home oximetry assessment on room air for oxygen qualification. Rest, exertion, sleep     Dx COPD

## 2010-12-05 NOTE — Assessment & Plan Note (Addendum)
We will try again to get a home O2 assessment . I don't see any reason why he would feel worse with oxygen, except he just resents it and wants it gone.

## 2010-12-05 NOTE — Progress Notes (Signed)
Subjective:    Patient ID: [name] [name], male    DOB: 1920/10/16, 77 y.o.   MRN: [id number]  HPI 12/05/10- 73 yoM former smoker followed for COPD, OSA complicated by HBP Last here June 06, 2010 He thinks his exercise capacity is better on room air than on 2 L oxygen.  He doesn't use CPAP at all, but sleeps with O2. He is leaving the [place], to follow with his local physicians because logistics are too hard to get scripts filled through [place].  He discussed dysphagia with Dr Annalee Genta- told to increase omeprazole from 20 >40mg , but he denies any acid/ heart burn. He wants it left alone.  Review of Systems Constitutional:   No-   weight loss, night sweats, fevers, chills, fatigue, lassitude. HEENT:   No-  headaches, difficulty swallowing, tooth/dental problems, sore throat,       No-  sneezing, itching, ear ache, nasal congestion, post nasal drip,  CV:  No-   chest pain, orthopnea, PND, swelling in lower extremities, anasarca, dizziness, palpitations Resp: No- acute   shortness of breath with exertion or at rest.              No-   productive cough,  No non-productive cough,  No-  coughing up of blood.              No-   change in color of mucus.  No- wheezing.   Skin: Legs itch GI:  No-   heartburn, indigestion, abdominal pain, nausea, vomiting, diarrhea,                 change in bowel habits, loss of appetite GU: No-   dysuria, change in color of urine, no urgency or frequency.  No- flank pain. MS:  No-   joint pain or swelling.  No- decreased range of motion.  No- back pain. Neuro- no change Psych:  No- change in mood or affect. No depression or anxiety.  No memory loss.      Objective:   Physical Exam General- Alert, Oriented, Affect-appropriate, Distress- none acute    Talkative       Portable O2 2 L Skin- excoriated eczema on ankles Lymphadenopathy- none Head- atraumatic            Eyes- Gross vision intact, PERRLA, conjunctivae clear secretions            Ears- Hearing,  canals normal            Nose- Clear, No- Septal dev, mucus, polyps, erosion, perforation             Throat- Mallampati II , mucosa clear , drainage- none, tonsils- atrophic Neck- flexible , trachea midline, no stridor , thyroid nl, carotid no bruit Chest - symmetrical excursion , unlabored           Heart/CV- RRR , no murmur , no gallop  , no rub, nl s1 s2                           - JVD- none , edema- none, stasis changes- none, varices- none           Lung- trace crackles left lateral base, cough- none , dullness-none, rub- none           Chest wall-  Abd- tender-no, distended-no, bowel sounds-present, HSM- no Br/ Gen/ Rectal- Not done, not indicated Extrem- cyanosis- none, clubbing, none, atrophy- none, strength- nl Neuro- grossly intact  to observation        Assessment & Plan:

## 2010-12-08 ENCOUNTER — Encounter: Payer: Self-pay | Admitting: Internal Medicine

## 2010-12-19 ENCOUNTER — Encounter: Payer: Self-pay | Admitting: Internal Medicine

## 2011-06-09 ENCOUNTER — Ambulatory Visit (INDEPENDENT_AMBULATORY_CARE_PROVIDER_SITE_OTHER): Payer: Medicare Other | Admitting: Internal Medicine

## 2011-06-09 ENCOUNTER — Encounter: Payer: Self-pay | Admitting: Internal Medicine

## 2011-06-09 VITALS — BP 108/68 | HR 72 | Ht 72.0 in | Wt 179.2 lb

## 2011-06-09 DIAGNOSIS — J449 Chronic obstructive pulmonary disease, unspecified: Secondary | ICD-10-CM

## 2011-06-09 DIAGNOSIS — G4733 Obstructive sleep apnea (adult) (pediatric): Secondary | ICD-10-CM

## 2011-06-09 NOTE — Progress Notes (Signed)
  Subjective:    Patient ID: [name] [name], male    DOB: 02-15-1921, 78 y.o.   MRN: [id number]  HPI    Review of Systems     Objective:   Physical Exam        Assessment & Plan:

## 2011-06-09 NOTE — Progress Notes (Signed)
Patient ID: [name] [name], male    DOB: 07/17/1920, 78 y.o.   MRN: [id number]  HPI 12/05/10- 78 yoM former smoker followed for COPD, OSA complicated by HBP Last here June 06, 2010 He thinks his exercise capacity is better on room air than on 2 L oxygen.  He doesn't use CPAP at all, but sleeps with O2. He is leaving the [place], to follow with his local physicians because logistics are too hard to get scripts filled through [place].  He discussed dysphagia with Dr Annalee Genta- told to increase omeprazole from 20 >40mg , but he denies any acid/ heart burn. He wants it left alone.   06/09/11- 78 yoM former smoker followed for COPD, OSA complicated by HBP Has not used CPAP in years. Stays on 2 L of oxygen. Gradually more short of breath with no acute change or event. Denies pain, blood, purulent discharge or palpitation.  Review of Systems Constitutional:   No-   weight loss, night sweats, fevers, chills, fatigue, lassitude. HEENT:   No-  headaches, difficulty swallowing, tooth/dental problems, sore throat,       No-  sneezing, itching, ear ache, nasal congestion, post nasal drip,  CV:  No-   chest pain, orthopnea, PND, swelling in lower extremities, anasarca, dizziness, palpitations Resp: No- change  shortness of breath with exertion or at rest.              No-   productive cough,  No non-productive cough,  No-  coughing up of blood.              No-   change in color of mucus.  No- wheezing.   Skin: Legs itch GI:  No-   heartburn, indigestion, abdominal pain, nausea, vomiting, diarrhea,                 change in bowel habits, loss of appetite GU:  MS:  No-   joint pain or swelling.  No- decreased range of motion.  No- back pain. Neuro- no change Psych:  No- change in mood or affect. No depression or anxiety.  No memory loss.      Objective:   Physical Exam General- Alert, Oriented, Affect-appropriate, Distress- none acute    Talkative       Portable O2 2 L 93% Skin- excoriated eczema on  ankles Lymphadenopathy- none Head- atraumatic            Eyes- Gross vision intact, PERRLA, conjunctivae clear secretions            Ears- Hearing, canals normal            Nose- Clear, No- Septal dev, mucus, polyps, erosion, perforation             Throat- Mallampati II , mucosa clear , drainage- none, tonsils- atrophic Neck- flexible , trachea midline, no stridor , thyroid nl, carotid no bruit Chest - symmetrical excursion , unlabored           Heart/CV- RRR , no murmur , no gallop  , no rub, nl s1 s2                           - JVD- none , edema- none, stasis changes- none, varices- none           Lung- clear, diminished, cough- none , dullness-none, rub- none           Chest wall-  Abd-  Br/  Gen/ Rectal- Not done, not indicated Extrem- cyanosis- none, clubbing, none, atrophy- none, strength- nl Neuro- grossly intact to observation

## 2011-06-09 NOTE — Patient Instructions (Signed)
Continue oxygen at 2 L  Please call as needed

## 2011-06-13 NOTE — Assessment & Plan Note (Signed)
Status is unchanged.

## 2011-06-13 NOTE — Assessment & Plan Note (Signed)
Chronic hypoxic respiratory failure with severe COPD Current treatment is appropriate.

## 2011-12-08 ENCOUNTER — Ambulatory Visit (INDEPENDENT_AMBULATORY_CARE_PROVIDER_SITE_OTHER)
Admission: RE | Admit: 2011-12-08 | Discharge: 2011-12-08 | Disposition: A | Discharge status: Home / Self Care | Payer: Medicare Other | Source: Ambulatory Visit | Attending: Internal Medicine | Admitting: Internal Medicine

## 2011-12-08 ENCOUNTER — Ambulatory Visit (INDEPENDENT_AMBULATORY_CARE_PROVIDER_SITE_OTHER): Payer: Medicare Other | Admitting: Internal Medicine

## 2011-12-08 ENCOUNTER — Encounter: Payer: Self-pay | Admitting: Internal Medicine

## 2011-12-08 VITALS — BP 120/60 | HR 61 | Ht 72.0 in | Wt 175.0 lb

## 2011-12-08 DIAGNOSIS — J4489 Other specified chronic obstructive pulmonary disease: Secondary | ICD-10-CM

## 2011-12-08 DIAGNOSIS — G4733 Obstructive sleep apnea (adult) (pediatric): Secondary | ICD-10-CM

## 2011-12-08 DIAGNOSIS — J449 Chronic obstructive pulmonary disease, unspecified: Secondary | ICD-10-CM

## 2011-12-08 MED ORDER — MOMETASONE FURO-FORMOTEROL FUM 100-5 MCG/ACT IN AERO: 2.0000 | INHALATION_SPRAY | Freq: Two times a day (BID) | RESPIRATORY_TRACT | Status: DC

## 2011-12-08 NOTE — Progress Notes (Signed)
Patient ID: [name] [name], male    DOB: Aug 08, 1920, 78 y.o.   MRN: [id number]  HPI 12/05/10- 78 yoM former smoker followed for COPD, OSA complicated by HBP Last here June 06, 2010 He thinks his exercise capacity is better on room air than on 2 L oxygen.  He doesn't use CPAP at all, but sleeps with O2. He is leaving the [place], to follow with his local physicians because logistics are too hard to get scripts filled through [place].  He discussed dysphagia with Dr Annalee Genta- told to increase omeprazole from 20 >40mg , but he denies any acid/ heart burn. He wants it left alone.   06/09/11- 78 yoM former smoker followed for COPD, OSA complicated by HBP Has not used CPAP in years. Stays on 2 L of oxygen. Gradually more short of breath with no acute change or event. Denies pain, blood, purulent discharge or palpitation.  12/08/11- 78 yoM former smoker followed for COPD, OSA complicated by HBP   Patient states same as last visit. c/o sob, wheezing, and constant runny nose.  Denies chest pain, chest tightness,and cough.  Findings oxygen prongs bothersome at night. We discussed hypoxia and options. He would like to try a metered rescue inhaler discussed this as well. He has some dry cough which is unchanged with no chest pain, no sputum, no blood or palpitation. Very limited exercise tolerance but he paces himself  Review of Systems- see HPI Constitutional:   No-   weight loss, night sweats, fevers, chills, fatigue, lassitude. HEENT:   No-  headaches, difficulty swallowing, tooth/dental problems, sore throat,       No-  sneezing, itching, ear ache, nasal congestion, post nasal drip,  CV:  No-   chest pain, orthopnea, PND, swelling in lower extremities, anasarca, dizziness, palpitations Resp: No- change  shortness of breath with exertion or at rest.              + productive cough,  No non-productive cough,  No-  coughing up of blood.              No-   change in color of mucus.  No- wheezing.   Skin:  Legs itch GI:  No-   heartburn, indigestion, abdominal pain, nausea, vomiting, GU:  MS:  No-   joint pain or swelling.   Neuro- no change Psych:  No- change in mood or affect. No depression or anxiety.  No memory loss.  Objective:   Physical Exam General- Alert, Oriented, Affect-appropriate, Distress- none acute    Talkative   [place] Portable O2 2 L 93% Skin- excoriated eczema on ankles Lymphadenopathy- none Head- atraumatic            Eyes- Gross vision intact, PERRLA, conjunctivae clear secretions            Ears- Hearing, canals normal            Nose- Clear, No- Septal dev, mucus, polyps, erosion, perforation             Throat- Mallampati II , mucosa clear , drainage- none, tonsils- atrophic Neck- flexible , trachea midline, no stridor , thyroid nl, carotid no bruit Chest - symmetrical excursion , unlabored           Heart/CV- RRR , no murmur , no gallop  , no rub, nl s1 s2                           -  JVD- none , edema- none, stasis changes- none, varices- none           Lung- clear/ distant, cough- none , dullness-none, rub- none, unlabored           Chest wall-  Abd-  Br/ Gen/ Rectal- Not done, not indicated Extrem- cyanosis- none, clubbing, none, atrophy- none, strength- nl Neuro- grossly intact to observation

## 2011-12-08 NOTE — Patient Instructions (Addendum)
Order- CXR  Dx COPD  Sample and script for Dulera 100    2 puffs, then rinse mouth, twice daily      See it this helps your breathing  Try Mucinex, about 600 mg twice daily   To thin mucus  Throat lozenges may help with the throat clearing  If you get short of breath when you are walking or doing other exertion, then try turning up the oxygen to 3L, just till you catch your breath, then go back to 2L.

## 2011-12-14 NOTE — Assessment & Plan Note (Signed)
He still has a CPAP machine at home somewhere is trying to use it

## 2011-12-14 NOTE — Assessment & Plan Note (Signed)
He has finally gotten into a pattern routine continuous use of oxygen. We discussed comfort issues and the medical basis for oxygen therapy. Plan-okay to turn oxygen up to 3 L during exertion if needed. Educated on CO2 retention. Update chest x-ray. Sample Dulera 100.

## 2012-06-28 ENCOUNTER — Telehealth: Payer: Self-pay | Admitting: Internal Medicine

## 2012-06-28 NOTE — Telephone Encounter (Signed)
Spoke with pt's son He states that the pt has been c/o having increased SOB and saying that he feels like he needs more o2 He asked if we could ever give him an inhaler to help I advised that he gave him sample and rx for dulera at the last visit, but he states that he does not feel he is using it at all I advised needs ov, and have scheduled him for first available with [facility] 08/10/12 and he is to call sooner if pt gets worse or seek emergent care sooner if needed.

## 2012-07-19 ENCOUNTER — Encounter (INDEPENDENT_AMBULATORY_CARE_PROVIDER_SITE_OTHER): Payer: Medicare Other | Admitting: *Deleted

## 2012-07-19 DIAGNOSIS — M79609 Pain in unspecified limb: Secondary | ICD-10-CM

## 2012-07-19 DIAGNOSIS — M7989 Other specified soft tissue disorders: Secondary | ICD-10-CM

## 2012-07-20 ENCOUNTER — Encounter: Payer: Self-pay | Admitting: Internal Medicine

## 2012-07-21 ENCOUNTER — Emergency Department (HOSPITAL_COMMUNITY): Payer: Medicare Other

## 2012-07-21 ENCOUNTER — Inpatient Hospital Stay (HOSPITAL_COMMUNITY)
Admission: EM | Admit: 2012-07-21 | Discharge: 2012-07-26 | DRG: 190 | Disposition: A | Discharge status: Skilled Nursing Facility | Payer: Medicare Other | Attending: Internal Medicine | Admitting: Internal Medicine

## 2012-07-21 ENCOUNTER — Encounter (HOSPITAL_COMMUNITY): Payer: Self-pay | Admitting: *Deleted

## 2012-07-21 DIAGNOSIS — R197 Diarrhea, unspecified: Secondary | ICD-10-CM | POA: Diagnosis present

## 2012-07-21 DIAGNOSIS — Z951 Presence of aortocoronary bypass graft: Secondary | ICD-10-CM

## 2012-07-21 DIAGNOSIS — R0902 Hypoxemia: Secondary | ICD-10-CM | POA: Diagnosis present

## 2012-07-21 DIAGNOSIS — Z954 Presence of other heart-valve replacement: Secondary | ICD-10-CM

## 2012-07-21 DIAGNOSIS — G4733 Obstructive sleep apnea (adult) (pediatric): Secondary | ICD-10-CM | POA: Diagnosis present

## 2012-07-21 DIAGNOSIS — Z9981 Dependence on supplemental oxygen: Secondary | ICD-10-CM

## 2012-07-21 DIAGNOSIS — R918 Other nonspecific abnormal finding of lung field: Secondary | ICD-10-CM | POA: Diagnosis present

## 2012-07-21 DIAGNOSIS — I10 Essential (primary) hypertension: Secondary | ICD-10-CM | POA: Diagnosis present

## 2012-07-21 DIAGNOSIS — R0609 Other forms of dyspnea: Secondary | ICD-10-CM

## 2012-07-21 DIAGNOSIS — E785 Hyperlipidemia, unspecified: Secondary | ICD-10-CM | POA: Diagnosis present

## 2012-07-21 DIAGNOSIS — J4489 Other specified chronic obstructive pulmonary disease: Secondary | ICD-10-CM | POA: Diagnosis present

## 2012-07-21 DIAGNOSIS — J449 Chronic obstructive pulmonary disease, unspecified: Secondary | ICD-10-CM

## 2012-07-21 DIAGNOSIS — R5381 Other malaise: Secondary | ICD-10-CM | POA: Diagnosis present

## 2012-07-21 DIAGNOSIS — J441 Chronic obstructive pulmonary disease with (acute) exacerbation: Principal | ICD-10-CM | POA: Diagnosis present

## 2012-07-21 DIAGNOSIS — I2510 Atherosclerotic heart disease of native coronary artery without angina pectoris: Secondary | ICD-10-CM | POA: Diagnosis present

## 2012-07-21 DIAGNOSIS — Z87891 Personal history of nicotine dependence: Secondary | ICD-10-CM

## 2012-07-21 DIAGNOSIS — Z9119 Patient's noncompliance with other medical treatment and regimen: Secondary | ICD-10-CM

## 2012-07-21 DIAGNOSIS — K5730 Diverticulosis of large intestine without perforation or abscess without bleeding: Secondary | ICD-10-CM | POA: Diagnosis present

## 2012-07-21 DIAGNOSIS — I509 Heart failure, unspecified: Secondary | ICD-10-CM | POA: Diagnosis present

## 2012-07-21 DIAGNOSIS — E876 Hypokalemia: Secondary | ICD-10-CM | POA: Diagnosis present

## 2012-07-21 DIAGNOSIS — R0600 Dyspnea, unspecified: Secondary | ICD-10-CM

## 2012-07-21 DIAGNOSIS — J9620 Acute and chronic respiratory failure, unspecified whether with hypoxia or hypercapnia: Secondary | ICD-10-CM | POA: Diagnosis present

## 2012-07-21 DIAGNOSIS — Z91199 Patient's noncompliance with other medical treatment and regimen due to unspecified reason: Secondary | ICD-10-CM

## 2012-07-21 DIAGNOSIS — I5031 Acute diastolic (congestive) heart failure: Secondary | ICD-10-CM | POA: Diagnosis present

## 2012-07-21 HISTORY — DX: Malignant (primary) neoplasm, unspecified: C80.1

## 2012-07-21 LAB — CBC WITH DIFFERENTIAL/PLATELET
Basophils Absolute: 0 10*3/uL (ref 0.0–0.1)
Basophils Relative: 0 % (ref 0–1)
Eosinophils Relative: 2 % (ref 0–5)
HCT: 37.8 % — ABNORMAL LOW (ref 39.0–52.0)
MCHC: 32 g/dL (ref 30.0–36.0)
MCV: 88.5 fL (ref 78.0–100.0)
Monocytes Absolute: 0.8 10*3/uL (ref 0.1–1.0)
Monocytes Relative: 13 % — ABNORMAL HIGH (ref 3–12)
RDW: 13.5 % (ref 11.5–15.5)

## 2012-07-21 LAB — COMPREHENSIVE METABOLIC PANEL
AST: 16 U/L (ref 0–37)
BUN: 21 mg/dL (ref 6–23)
CO2: 29 mEq/L (ref 19–32)
Calcium: 9 mg/dL (ref 8.4–10.5)
Creatinine, Ser: 0.87 mg/dL (ref 0.50–1.35)
GFR calc non Af Amer: 74 mL/min — ABNORMAL LOW (ref >90)

## 2012-07-21 LAB — LIPASE, BLOOD: Lipase: 12 U/L (ref 11–59)

## 2012-07-21 LAB — PROTIME-INR
INR: 1.07 (ref 0.00–1.49)
Prothrombin Time: 13.8 seconds (ref 11.6–15.2)

## 2012-07-21 MED ORDER — METHYLPREDNISOLONE SODIUM SUCC 125 MG IJ SOLR
60.0000 mg | Freq: Four times a day (QID) | INTRAMUSCULAR | Status: DC
  Administered 2012-07-21 – 2012-07-23 (×7): 60 mg via INTRAVENOUS
  Filled 2012-07-21 (×11): qty 0.96

## 2012-07-21 MED ORDER — METHYLPREDNISOLONE SODIUM SUCC 125 MG IJ SOLR
125.0000 mg | INTRAMUSCULAR | Status: AC
  Administered 2012-07-21: 125 mg via INTRAVENOUS
  Filled 2012-07-21: qty 2

## 2012-07-21 MED ORDER — ZOLPIDEM TARTRATE 5 MG PO TABS
5.0000 mg | ORAL_TABLET | Freq: Every evening | ORAL | Status: DC | PRN
  Administered 2012-07-25: 5 mg via ORAL
  Filled 2012-07-21: qty 1

## 2012-07-21 MED ORDER — SODIUM CHLORIDE 0.9 % IV BOLUS (SEPSIS)
1000.0000 mL | Freq: Once | INTRAVENOUS | Status: AC
  Administered 2012-07-21: 1000 mL via INTRAVENOUS

## 2012-07-21 MED ORDER — MOMETASONE FURO-FORMOTEROL FUM 100-5 MCG/ACT IN AERO
2.0000 | INHALATION_SPRAY | Freq: Two times a day (BID) | RESPIRATORY_TRACT | Status: DC
  Administered 2012-07-21 – 2012-07-26 (×10): 2 via RESPIRATORY_TRACT
  Filled 2012-07-21: qty 8.8

## 2012-07-21 MED ORDER — PANTOPRAZOLE SODIUM 40 MG PO TBEC
40.0000 mg | DELAYED_RELEASE_TABLET | Freq: Every day | ORAL | Status: DC
  Administered 2012-07-21 – 2012-07-26 (×6): 40 mg via ORAL
  Filled 2012-07-21 (×6): qty 1

## 2012-07-21 MED ORDER — POTASSIUM CHLORIDE 10 MEQ/100ML IV SOLN
10.0000 meq | INTRAVENOUS | Status: AC
  Administered 2012-07-21 (×3): 10 meq via INTRAVENOUS
  Filled 2012-07-21 (×3): qty 100

## 2012-07-21 MED ORDER — ALBUTEROL SULFATE HFA 108 (90 BASE) MCG/ACT IN AERS
2.0000 | INHALATION_SPRAY | Freq: Four times a day (QID) | RESPIRATORY_TRACT | Status: DC
  Administered 2012-07-21 – 2012-07-22 (×2): 2 via RESPIRATORY_TRACT
  Filled 2012-07-21: qty 6.7

## 2012-07-21 MED ORDER — ALBUTEROL SULFATE (5 MG/ML) 0.5% IN NEBU: 2.5000 mg | INHALATION_SOLUTION | RESPIRATORY_TRACT | Status: DC

## 2012-07-21 MED ORDER — ACETAMINOPHEN 650 MG RE SUPP: 650.0000 mg | Freq: Four times a day (QID) | RECTAL | Status: DC | PRN

## 2012-07-21 MED ORDER — ASPIRIN 81 MG PO CHEW
81.0000 mg | CHEWABLE_TABLET | Freq: Every day | ORAL | Status: DC
  Administered 2012-07-21 – 2012-07-26 (×6): 81 mg via ORAL
  Filled 2012-07-21 (×6): qty 1

## 2012-07-21 MED ORDER — TIOTROPIUM BROMIDE MONOHYDRATE 18 MCG IN CAPS
18.0000 ug | ORAL_CAPSULE | Freq: Every day | RESPIRATORY_TRACT | Status: DC
  Administered 2012-07-22 – 2012-07-26 (×5): 18 ug via RESPIRATORY_TRACT
  Filled 2012-07-21 (×2): qty 5

## 2012-07-21 MED ORDER — FUROSEMIDE 10 MG/ML IJ SOLN
40.0000 mg | Freq: Once | INTRAMUSCULAR | Status: AC
  Administered 2012-07-21: 40 mg via INTRAVENOUS
  Filled 2012-07-21: qty 4

## 2012-07-21 MED ORDER — ATENOLOL 50 MG PO TABS
50.0000 mg | ORAL_TABLET | Freq: Every day | ORAL | Status: DC
  Administered 2012-07-21 – 2012-07-26 (×6): 50 mg via ORAL
  Filled 2012-07-21 (×6): qty 1

## 2012-07-21 MED ORDER — HYDROCHLOROTHIAZIDE 25 MG PO TABS
25.0000 mg | ORAL_TABLET | Freq: Every day | ORAL | Status: DC
  Administered 2012-07-21: 25 mg via ORAL
  Filled 2012-07-21 (×2): qty 1

## 2012-07-21 MED ORDER — ASPIRIN 81 MG PO TABS: 81.0000 mg | ORAL_TABLET | Freq: Every day | ORAL | Status: DC

## 2012-07-21 MED ORDER — HYDROCODONE-ACETAMINOPHEN 5-325 MG PO TABS: 1.0000 | ORAL_TABLET | Freq: Four times a day (QID) | ORAL | Status: DC | PRN

## 2012-07-21 MED ORDER — ACETAMINOPHEN 325 MG PO TABS: 650.0000 mg | ORAL_TABLET | Freq: Four times a day (QID) | ORAL | Status: DC | PRN

## 2012-07-21 MED ORDER — ENOXAPARIN SODIUM 40 MG/0.4ML ~~LOC~~ SOLN
40.0000 mg | Freq: Every day | SUBCUTANEOUS | Status: DC
  Administered 2012-07-21 – 2012-07-25 (×5): 40 mg via SUBCUTANEOUS
  Filled 2012-07-21 (×6): qty 0.4

## 2012-07-21 MED ORDER — ATORVASTATIN CALCIUM 40 MG PO TABS
40.0000 mg | ORAL_TABLET | Freq: Every evening | ORAL | Status: DC
  Administered 2012-07-21 – 2012-07-25 (×5): 40 mg via ORAL
  Filled 2012-07-21 (×6): qty 1

## 2012-07-21 MED ORDER — AMLODIPINE BESYLATE 10 MG PO TABS
10.0000 mg | ORAL_TABLET | Freq: Every day | ORAL | Status: DC
  Administered 2012-07-21 – 2012-07-26 (×6): 10 mg via ORAL
  Filled 2012-07-21 (×6): qty 1

## 2012-07-21 NOTE — ED Provider Notes (Signed)
History     CSN: [id number]  Arrival date & time 07/21/12  0803   First MD Initiated Contact with Patient 07/21/12 0815      Chief Complaint  Patient presents with  . Shortness of Breath  . Diarrhea    (Consider location/radiation/quality/duration/timing/severity/associated sxs/prior treatment) HPI  Patient presents with multiple complaints.  History of present illness is per the patient and his son. They state that several weeks ago, without clear precipitant the patient began to have increasing dyspnea.  In spite of using additional supplemental oxygen, albuterol inhaler, inhaled steroid, his symptoms have progressed.  There is now minimal capacity to perform activities of daily living. Over the past few days the patient has also expressed multiple episodes of loose stool. The patient denies any abdominal pain, chest pain, headache, confusion, disorientation, fever, cough. Has a notable history of COPD, as well as prostate cancer, currently being observed, and aortic valve repair with porcine valve.  Past Medical History  Diagnosis Date  . COPD (chronic obstructive pulmonary disease)   . Sleep apnea   . Hyperlipidemia   . Hypertension   . Cancer     prostate cancer    Past Surgical History  Procedure Laterality Date  . Aortic valve replacement      surgery prior to 2004, bovine  . Cholecystectomy    . Shoulder surgery  June 2011    left  . Prostate surgery      prostate removed    Family History  Problem Relation Age of Onset  . COPD Brother   . Stroke Father   . Diabetes Father     History  Substance Use Topics  . Smoking status: Former Games developer  . Smokeless tobacco: Not on file  . Alcohol Use: Not on file      Review of Systems  Constitutional:       Per HPI, otherwise negative  HENT:       Per HPI, otherwise negative  Respiratory:       Per HPI, otherwise negative  Cardiovascular:       Per HPI, otherwise negative  Gastrointestinal: Negative for  vomiting.  Endocrine:       Negative aside from HPI  Genitourinary:       Neg aside from HPI   Musculoskeletal:       Per HPI, otherwise negative  Skin: Negative.   Neurological: Negative for syncope.    Allergies  Review of patient's allergies indicates no known allergies.  Home Medications   Current Outpatient Rx  Name  Route  Sig  Dispense  Refill  . amLODipine (NORVASC) 5 MG tablet   Oral   Take 10 mg by mouth daily.          Marland Kitchen aspirin 81 MG tablet   Oral   Take 81 mg by mouth daily.           Marland Kitchen atenolol (TENORMIN) 50 MG tablet   Oral   Take 50 mg by mouth daily.           Marland Kitchen EXPIRED: clonazePAM (KLONOPIN) 0.5 MG tablet      Take 1 or 2 at bedtime as needed for sleep   50 tablet   5   . felodipine (PLENDIL) 10 MG 24 hr tablet   Oral   Take 10 mg by mouth daily.           . hydrochlorothiazide 25 MG tablet   Oral   Take 25 mg by mouth  daily.           . mometasone-formoterol (DULERA) 100-5 MCG/ACT AERO   Inhalation   Inhale 2 puffs into the lungs 2 (two) times daily. Rinse mouth   1 Inhaler   prn   . omeprazole (PRILOSEC) 20 MG capsule   Oral   Take 20 mg by mouth daily.           . simvastatin (ZOCOR) 80 MG tablet   Oral   Take 80 mg by mouth at bedtime.             BP 135/63  Pulse 79  Temp(Src) 97.7 F (36.5 C) (Oral)  Resp 22  SpO2 97%  Physical Exam  Nursing note and vitals reviewed. Constitutional: He is oriented to person, place, and time. He appears well-developed. No distress.  HENT:  Head: Normocephalic and atraumatic.  Eyes: Conjunctivae and EOM are normal.  Cardiovascular: Normal rate and regular rhythm.   Murmur heard. Pulmonary/Chest: No stridor. Tachypnea noted. No respiratory distress. He has decreased breath sounds.  Abdominal: He exhibits no distension.  Musculoskeletal: He exhibits no edema.  Neurological: He is alert and oriented to person, place, and time.  Skin: Skin is warm and dry.  Psychiatric: He  has a normal mood and affect.    ED Course  Procedures (including critical care time)  Labs Reviewed  CBC WITH DIFFERENTIAL  COMPREHENSIVE METABOLIC PANEL  LIPASE, BLOOD  PROTIME-INR  PRO B NATRIURETIC PEPTIDE   No results found.   No diagnosis found.  On initial exam the patient has hypoxia, 84% with nasal cannula.  This improves to 97% with a nonrebreather mask.  This is abnormal.  Cardiac 80 sinus rhythm normal    Date: 07/21/2012  Rate: 69  Rhythm: normal sinus rhythm  QRS Axis: normal  Intervals: normal  ST/T Wave abnormalities: normal  Conduction Disutrbances:nonspecific intraventricular conduction delay  Narrative Interpretation:   Old EKG Reviewed: unchanged ABNORMAL   10:23 AM All results discussed with the patient and his family.  Given the elevated BNP, the opacification on chest x-ray he was administered Lasix, though he has no recent echocardiogram.  Has a leukocytosis, fever, cough there is low suspicion for pneumonia, though this is a consideration.  MDM  Patient presents with ongoing/worsening dyspnea, fatigue and new diarrhea.  The patient's labs are notable for demonstration of elevated BNP, hypokalemia.  Given his dyspnea there suspicion for multifactorial etiology, including likely COPD exacerbation with possible cardiac component.  The patient was admitted for further evaluation and management after repletion of his potassium, provision of steroids, provision of Lasix.        Gerhard Munch, MD 07/21/12 1024

## 2012-07-21 NOTE — Care Management (Signed)
CARE MANAGEMENT NOTE 07/21/2012  Patient:  [name] [name], [name] [name]   Account Number:  [id number]  Date Initiated:  07/21/2012  Documentation initiated by:  Elnita Surprenant  Subjective/Objective Assessment:   79 yo male admitted with COPD exacerbation & diarrhea. PTA pt from home with son assisting in home care.     Action/Plan:   Home when stable   Anticipated DC Date:     Anticipated DC Plan:  HOME/SELF CARE         Choice offered to / List presented to:             Status of service:  In process, will continue to follow Medicare Important Message given?   (If response is "NO", the following Medicare IM given date fields will be blank) Date Medicare IM given:   Date Additional Medicare IM given:    Discharge Disposition:    Per UR Regulation:  Reviewed for med. necessity/level of care/duration of stay  If discussed at Long Length of Stay Meetings, dates discussed:    Comments:  07/21/12 1600 Edel Rivero,RN,BSN [phone] Pt from home with home oxygen therapy. CM to follow up with patient concerning DME provider & possible [facility] services upon discharge if appropriate.

## 2012-07-21 NOTE — ED Notes (Signed)
Report called to [phone]

## 2012-07-21 NOTE — H&P (Signed)
PCP:   Minda Meo, MD   Chief Complaint:  Shortness of breath and diarrhea  HPI: 79 yo male with oxygen dependent copd, osa, ascad and avr presenting unannounced to er with increased sob.  As baseline on home oxygen and intermittently compliant. Using home nebs bid at most with poor outpatient follow-up.  Chronic diarrhea followed by [facility]--no blood, no pain, no weight loss.  Finally, recent difficulty with burning and stinging toes, recently tried neurontin, lyrica and ambien.  Has been found to have b12 deficiency--not started replacement as yet.  To Er by ems with sudden increase in SOB this am--better now. No cp, blood, fever or sputum. Some LE swelling.  Admitted for nebs and steroids.   Past Medical History: Past Medical History  Diagnosis Date  . COPD (chronic obstructive pulmonary disease)   . Sleep apnea   . Hyperlipidemia   . Hypertension   . Cancer     prostate cancer   Past Surgical History  Procedure Laterality Date  . Aortic valve replacement      surgery prior to 2004, bovine  . Cholecystectomy    . Shoulder surgery  June 2011    left  . Prostate surgery      prostate removed    Medications: Prior to Admission medications   Medication Sig Start Date End Date Taking? Authorizing Provider  amLODipine (NORVASC) 5 MG tablet Take 10 mg by mouth daily.    Yes Historical Provider, MD  aspirin 81 MG tablet Take 81 mg by mouth daily.     Yes Historical Provider, MD  atenolol (TENORMIN) 50 MG tablet Take 50 mg by mouth daily.     Yes Historical Provider, MD  diphenoxylate-atropine (LOMOTIL) 2.5-0.025 MG per tablet Take 1 tablet by mouth 4 (four) times daily as needed for diarrhea or loose stools.   Yes Historical Provider, MD  felodipine (PLENDIL) 10 MG 24 hr tablet Take 10 mg by mouth daily.     Yes Historical Provider, MD  hydrochlorothiazide 25 MG tablet Take 25 mg by mouth daily.     Yes Historical Provider, MD  HYDROcodone-acetaminophen (NORCO/VICODIN)  5-325 MG per tablet Take 1 tablet by mouth every 6 (six) hours as needed for pain.   Yes Historical Provider, MD  mometasone-formoterol (DULERA) 100-5 MCG/ACT AERO Inhale 2 puffs into the lungs 2 (two) times daily. Rinse mouth 12/08/11 12/07/12 Yes Waymon Budge, MD  omeprazole (PRILOSEC) 20 MG capsule Take 20 mg by mouth daily.     Yes Historical Provider, MD  paregoric 2 MG/5ML solution Take 5-10 mLs by mouth 4 (four) times daily as needed for diarrhea or loose stools.   Yes Historical Provider, MD  simvastatin (ZOCOR) 80 MG tablet Take 80 mg by mouth at bedtime.     Yes Historical Provider, MD  zolpidem (AMBIEN) 5 MG tablet Take 5 mg by mouth at bedtime as needed for sleep.   Yes Historical Provider, MD  clonazePAM (KLONOPIN) 0.5 MG tablet Take 1 or 2 at bedtime as needed for sleep 07/03/10 01/07/12  Waymon Budge, MD  zolpidem (AMBIEN) 10 MG tablet Take 1 tablet (10 mg total) by mouth at bedtime as needed for sleep. 07/03/10 08/02/10  Waymon Budge, MD    Allergies:  No Known Allergies  Social History:  reports that he has quit smoking. He has never used smokeless tobacco. He reports that he does not drink alcohol. His drug history is not on file.  Family History: Family History  Problem Relation Age of Onset  . COPD Brother   . Stroke Father   . Diabetes Father     Physical Exam: Filed Vitals:   07/21/12 0817 07/21/12 0909 07/21/12 1256 07/21/12 1618  BP:  135/54 141/62 132/56  Pulse:  66 80 77  Temp:    97.9 F (36.6 C)  TempSrc:    Oral  Resp:  18 20 18   Height:    6' (1.829 m)  Weight:    80.1 kg (176 lb 9.4 oz)  SpO2: 97% 97% 92% 93%   General appearance: alert and cooperative Head: Normocephalic, without obvious abnormality, atraumatic Eyes: conjunctivae/corneas clear. PERRL, EOM's intact.  Nose: Nares normal. Septum midline. Mucosa normal. No drainage or sinus tenderness. Throat: lips, mucosa, and tongue normal; teeth and gums normal Neck: no adenopathy, no  carotid bruit, no JVD and thyroid not enlarged, symmetric, no tenderness/mass/nodules Resp: clear to auscultation bilaterally Cardio: regular rate and rhythm, 2/6 sem GI: soft, non-tender; bowel sounds normal; no masses,  no organomegaly Extremities: extremities normal, atraumatic, no cyanosis , 1+ edema, neg homan Pulses: 2+ and symmetric Lymph nodes: Cervical adenopathy: no cervical lymphadenopathy Neurologic: Alert and oriented X 3, normal strength and tone. Normal symmetric reflexes.  psoriasis    Labs on Admission:   Recent Labs  07/21/12 0900  NA 141  K 3.2*  CL 103  CO2 29  GLUCOSE 107*  BUN 21  CREATININE 0.87  CALCIUM 9.0    Recent Labs  07/21/12 0900  AST 16  ALT 11  ALKPHOS 53  BILITOT 0.4  PROT 7.0  ALBUMIN 3.0*    Recent Labs  07/21/12 0900  LIPASE 12    Recent Labs  07/21/12 0900  WBC 6.2  NEUTROABS 4.9  HGB 12.1*  HCT 37.8*  MCV 88.5  PLT 185   No results found for this basename: CKTOTAL, CKMB, CKMBINDEX, TROPONINI,  in the last 72 hours No results found for this basename: TSH, T4TOTAL, FREET3, T3FREE, THYROIDAB,  in the last 72 hours No results found for this basename: VITAMINB12, FOLATE, FERRITIN, TIBC, IRON, RETICCTPCT,  in the last 72 hours  Radiological Exams on Admission: Dg Chest 2 View  07/21/2012  *RADIOLOGY REPORT*  Clinical Data: Short of breath  CHEST - 2 VIEW  Comparison: 12/08/2011  Findings: Patchy and ill-defined opacities throughout the right lung have developed.  Scarring at the left base.  Linear atelectasis at the right base.  No pneumothorax.  Upper normal heart size.  Postoperative changes.  Focal airspace disease at the posterior right lung base.  No pneumothorax.  Osteopenia.  IMPRESSION: Patchy airspace and ill-defined opacities within the right lung as described.  This is nonspecific and may reflect an inflammatory process.  Other causes of airspace disease should be considered.   Original Report Authenticated By:  Jolaine Click, M.D.    Orders placed during the hospital encounter of 07/21/12  . EKG 12-LEAD  . EKG 12-LEAD  . EKG 12-LEAD  . EKG 12-LEAD    Assessment/Plan Active Problems:   OBSTRUCTIVE SLEEP APNEA   COPD exacerbation-    Hypoxemia- home oxygen   CAD-stable    AVR- bovine, stable    Chronic diarrhea Admit for steroids, nebs, venous doppler  Jalan Fariss A 07/21/2012, 9:17 PM

## 2012-07-21 NOTE — Progress Notes (Signed)
Pt confirms pcp is Geoffry Paradise  EPIC updated

## 2012-07-21 NOTE — Progress Notes (Signed)
Patient still has cardiac monitoring order, ED said patient needs med-surg bed , DR Aronson's service called, left message with answering service for on call physician to clarify order.

## 2012-07-21 NOTE — ED Notes (Signed)
Pt reports not feeling well x2 weeks, pt wears 2 L [place] continuously, hx of COPD. In last 5 days pt upped 3L [place]. Hx of spastic colon, in last 2 days severe "excessive" diarrhea. Reports last nights bowel movement was black. Denies vomiting. Decreased appetite.   md at bedside Pt alert and oriented x4. Skin warm and dry. In no acute distress. Denies needs.

## 2012-07-21 NOTE — Consult Note (Signed)
PULMONARY  / CRITICAL CARE MEDICINE  Name: [name] [name] MRN: [id number] DOB: 18-Mar-1921    ADMISSION DATE:  07/21/2012 CONSULTATION DATE:  07/21/2012  REFERRING MD :  Dr. Jacky Kindle PRIMARY SERVICE:  Dr. Jacky Kindle  CHIEF COMPLAINT:  Increasing shortness of breath  BRIEF PATIENT DESCRIPTION: 79 y/o WM, former smoker, with O2 dependent COPD, OSA, CAD s/p CABG with bovine valve and dysphagia admitted on 4/9 with 2 month hx of diarrheal illness, weakness and worsening shortness of breath despite O2, home nebs.  ER evaluation demonstrates temp 97.9, sats 84% on [place], elevated BNP and CXR with R airspace disease.  PCCM consulted for pulmonary evaluation.    SIGNIFICANT EVENTS / STUDIES:  4/7 - LE Doppler >>no evidence of SVT/DVT  LINES / TUBES:   CULTURES: 4/9 Sputum>>>  ANTIBIOTICS:   HISTORY OF PRESENT ILLNESS: 79 y/o WM, former smoker, with O2 dependent COPD, OSA, CAD s/p CABG with bovine valve and dysphagia admitted on 4/9 with 2 month hx of diarrheal illness, weakness and worsening shortness of breath despite O2, home nebs.  ER evaluation demonstrates temp 97.9, sats 84% on [place], elevated BNP and CXR with R airspace disease.    Patient reports he sought care due to concerns for diarrhea.  Onset approx 2 months pta.  Intermittent watery stools.  Stool incontinence at times.  At baseline he can walk 75 ft on a good day with his O2 and then rest.  He uses his oxygen as needed but does wear at night.  Of late, he has felt weak and not able to tolerate activity as well.  He is a former 1ppd smoker for 22 years - quit 50 years ago.  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  He is followed by Dr. Maple Hudson as an outpatient, last seen 12/08/11.  He denies sputum production, fevers, chills, n/v.      PAST MEDICAL HISTORY :  Past Medical History  Diagnosis Date  . COPD (chronic obstructive pulmonary disease)   . Sleep apnea   . Hyperlipidemia   .  Hypertension   . Cancer     prostate cancer   Past Surgical History  Procedure Laterality Date  . Aortic valve replacement      surgery prior to 2004, bovine  . Cholecystectomy    . Shoulder surgery  June 2011    left  . Prostate surgery      prostate removed   Prior to Admission medications   Medication Sig Start Date End Date Taking? Authorizing Provider  amLODipine (NORVASC) 5 MG tablet Take 10 mg by mouth daily.    Yes Historical Provider, MD  aspirin 81 MG tablet Take 81 mg by mouth daily.     Yes Historical Provider, MD  atenolol (TENORMIN) 50 MG tablet Take 50 mg by mouth daily.     Yes Historical Provider, MD  diphenoxylate-atropine (LOMOTIL) 2.5-0.025 MG per tablet Take 1 tablet by mouth 4 (four) times daily as needed for diarrhea or loose stools.   Yes Historical Provider, MD  felodipine (PLENDIL) 10 MG 24 hr tablet Take 10 mg by mouth daily.     Yes Historical Provider, MD  hydrochlorothiazide 25 MG tablet Take 25 mg by mouth daily.     Yes Historical Provider, MD  HYDROcodone-acetaminophen (NORCO/VICODIN) 5-325 MG per tablet Take 1 tablet by mouth every 6 (six) hours as needed for pain.   Yes Historical Provider, MD  mometasone-formoterol (  DULERA) 100-5 MCG/ACT AERO Inhale 2 puffs into the lungs 2 (two) times daily. Rinse mouth 12/08/11 12/07/12 Yes Waymon Budge, MD  omeprazole (PRILOSEC) 20 MG capsule Take 20 mg by mouth daily.     Yes Historical Provider, MD  paregoric 2 MG/5ML solution Take 5-10 mLs by mouth 4 (four) times daily as needed for diarrhea or loose stools.   Yes Historical Provider, MD  simvastatin (ZOCOR) 80 MG tablet Take 80 mg by mouth at bedtime.     Yes Historical Provider, MD  zolpidem (AMBIEN) 5 MG tablet Take 5 mg by mouth at bedtime as needed for sleep.   Yes Historical Provider, MD  clonazePAM (KLONOPIN) 0.5 MG tablet Take 1 or 2 at bedtime as needed for sleep 07/03/10 01/07/12  Waymon Budge, MD  zolpidem (AMBIEN) 10 MG tablet Take 1 tablet (10 mg  total) by mouth at bedtime as needed for sleep. 07/03/10 08/02/10  Waymon Budge, MD   No Known Allergies  FAMILY HISTORY:  Family History  Problem Relation Age of Onset  . COPD Brother   . Stroke Father   . Diabetes Father    SOCIAL HISTORY:  reports that he has quit smoking. He has never used smokeless tobacco. He reports that he does not drink alcohol. His drug history is not on file.  REVIEW OF SYSTEMS:   See HPI.  All systems reviewed and pertinent positives in HPI.    SUBJECTIVE:   VITAL SIGNS: Temp:  [97.7 F (36.5 C)-97.9 F (36.6 C)] 97.9 F (36.6 C) (04/09 1618) Pulse Rate:  [66-80] 77 (04/09 1618) Resp:  [18-22] 18 (04/09 1618) BP: (132-141)/(54-63) 132/56 mmHg (04/09 1618) SpO2:  [84 %-97 %] 93 % (04/09 1618) Weight:  [176 lb 9.4 oz (80.1 kg)] 176 lb 9.4 oz (80.1 kg) (04/09 1618)  PHYSICAL EXAMINATION: General:  wdwn elderly male in NAD Neuro:  AAOx4, speech clear, MAE HEENT:  Mm pink/moist, no JVD Cardiovascular:  s1s2 with SEM, regular Lungs:  resp's even/non-labored, lungs bilaterally essentially clear Abdomen:  Round/soft, bsx4 active Musculoskeletal:  No acute deformities Skin:  Warm/dry, 1-2+ pitting LE edema, L>R    Recent Labs Lab 07/21/12 0900  NA 141  K 3.2*  CL 103  CO2 29  BUN 21  CREATININE 0.87  GLUCOSE 107*    Recent Labs Lab 07/21/12 0900  HGB 12.1*  HCT 37.8*  WBC 6.2  PLT 185   Dg Chest 2 View  07/21/2012  *RADIOLOGY REPORT*  Clinical Data: Short of breath  CHEST - 2 VIEW  Comparison: 12/08/2011  Findings: Patchy and ill-defined opacities throughout the right lung have developed.  Scarring at the left base.  Linear atelectasis at the right base.  No pneumothorax.  Upper normal heart size.  Postoperative changes.  Focal airspace disease at the posterior right lung base.  No pneumothorax.  Osteopenia.  IMPRESSION: Patchy airspace and ill-defined opacities within the right lung as described.  This is nonspecific and may reflect  an inflammatory process.  Other causes of airspace disease should be considered.   Original Report Authenticated By: Jolaine Click, M.D.     ASSESSMENT / PLAN:  Dyspnea COPD OSA  Likley multifactorial dyspnea in setting of COPD, untreated OSA (question element of PAH with OSA), element of mild volume overload and potential hypoxia.  Does give subjective hx of acute exacerbation of COPD.    Plan: -ambulatory saturation test on 2L O2 & monitoring while inpatient to determine if increase in O2 needed -  diuresis per primary SVC -trial spiriva + albuterol with spacer to see if any improvement in symptoms (he is not on any scheduled pulmonary meds at baseline) -O2 to keep sats 90-95% -will need outpt pulmonary follow up arranged prior to discharge   Diarrhea   Plan: -work up per primary SVC   Canary Brim, NP-C [facility] Pgr: [phone] or [phone]  Levy Pupa, MD, PhD 07/21/2012, 6:29 PM [facility] [phone] or if no answer [phone]

## 2012-07-21 NOTE — ED Notes (Signed)
Provided pt ice chip per request, tolerating well, will continue to monitor.

## 2012-07-22 DIAGNOSIS — I10 Essential (primary) hypertension: Secondary | ICD-10-CM

## 2012-07-22 DIAGNOSIS — R918 Other nonspecific abnormal finding of lung field: Secondary | ICD-10-CM | POA: Diagnosis present

## 2012-07-22 DIAGNOSIS — J441 Chronic obstructive pulmonary disease with (acute) exacerbation: Principal | ICD-10-CM | POA: Diagnosis present

## 2012-07-22 MED ORDER — BIOTENE DRY MOUTH MT LIQD
15.0000 mL | Freq: Two times a day (BID) | OROMUCOSAL | Status: DC
  Administered 2012-07-22 – 2012-07-26 (×6): 15 mL via OROMUCOSAL

## 2012-07-22 MED ORDER — CHOLESTYRAMINE LIGHT 4 G PO PACK
4.0000 g | PACK | Freq: Two times a day (BID) | ORAL | Status: DC
  Administered 2012-07-22 – 2012-07-24 (×5): 4 g via ORAL
  Filled 2012-07-22 (×6): qty 1

## 2012-07-22 MED ORDER — METRONIDAZOLE 500 MG PO TABS
500.0000 mg | ORAL_TABLET | Freq: Three times a day (TID) | ORAL | Status: DC
  Administered 2012-07-22 – 2012-07-24 (×7): 500 mg via ORAL
  Filled 2012-07-22 (×10): qty 1

## 2012-07-22 MED ORDER — ALBUTEROL SULFATE HFA 108 (90 BASE) MCG/ACT IN AERS
2.0000 | INHALATION_SPRAY | RESPIRATORY_TRACT | Status: DC | PRN
  Filled 2012-07-22: qty 6.7

## 2012-07-22 MED ORDER — FUROSEMIDE 20 MG PO TABS
20.0000 mg | ORAL_TABLET | Freq: Every day | ORAL | Status: DC
  Administered 2012-07-22 – 2012-07-26 (×5): 20 mg via ORAL
  Filled 2012-07-22 (×5): qty 1

## 2012-07-22 NOTE — Progress Notes (Signed)
PT Cancellation Note  Patient Details Name: [name] [name] MRN: [id number] DOB: 03/13/1921   Cancelled Treatment:    Reason Eval/Treat Not Completed: Medical issues which prohibited therapy, having diarrhea   [name] 07/22/2012, 3:17 PM [facility] PT [phone]

## 2012-07-22 NOTE — Progress Notes (Signed)
OT Cancellation Note  Patient Details Name: [name] [name] MRN: [id number] DOB: [birth year]   Cancelled Treatment:    Reason Eval/Treat Not Completed: Other (comment)  Spoke to RN.  Pt is having diarrhea with any movement.  Will check back tomorrow.   [name] 07/22/2012, 3:06 PM Marica Otter, OTR/L [phone] 07/22/2012

## 2012-07-22 NOTE — Progress Notes (Signed)
PULMONARY  / CRITICAL CARE MEDICINE  Name: [name] [name] MRN: [id number] DOB: 23-Mar-1921    ADMISSION DATE:  07/21/2012 CONSULTATION DATE:  07/21/2012  REFERRING MD :  Dr. Jacky Kindle  CHIEF COMPLAINT:  Increasing shortness of breath  BRIEF PATIENT DESCRIPTION: 79 yo male former smoker admitted with 2 months diarrhea, weakness, and progressive dyspnea.  Noted to have hypoxia, elevated BNP, and ASD on CXR in ER.  PCCM consulted.  Followed by Dr. Jetty Duhamel as outpt.  PMhx of COPD on home oxygen, OSA, CAD s/p CABG, s/p bovine aortic valve, Dysphagia, prostate cancer  PFT 07/30/05 >> FEV1 1.89 (71%), FEV1% 60, TLC 5.55 (84%), DLCO 73%, +BD  SIGNIFICANT EVENTS: 4/9 Admit, PCCM consulted  STUDIES:  4/7 - LE Doppler >>no evidence of SVT/DVT  LINES / TUBES:   CULTURES: 4/9 Sputum>>> 4/9 C diff >>>  ANTIBIOTICS: 4/9 Flagyl >>>  SUBJECTIVE:  Feels breathing is much better.  Denies cough or chest congestion.  Notes wheezing prior to coming to hospital.  He reports that he does not always use his oxygen at night.  He has not been using his CPAP.  VITAL SIGNS: Temp:  [97.7 F (36.5 C)-97.9 F (36.6 C)] 97.7 F (36.5 C) (04/10 0600) Pulse Rate:  [66-89] 75 (04/10 0600) Resp:  [16-22] 19 (04/10 0600) BP: (113-157)/(54-72) 157/72 mmHg (04/10 0600) SpO2:  [84 %-97 %] 96 % (04/10 0600) Weight:  [176 lb 9.4 oz (80.1 kg)] 176 lb 9.4 oz (80.1 kg) (04/09 1618)  PHYSICAL EXAMINATION: General: No distress Neuro: normal strength HEENT: no sinus tenderness Cardiovascular:  s1s2 with SEM, regular Lungs: good air entry b/l, no wheeze Abdomen:  Soft, non tender Musculoskeletal:  No edema Skin:  No rashes   Recent Labs Lab 07/21/12 0900  NA 141  K 3.2*  CL 103  CO2 29  BUN 21  CREATININE 0.87  GLUCOSE 107*    Recent Labs Lab 07/21/12 0900  HGB 12.1*  HCT 37.8*  WBC 6.2  PLT 185   Dg Chest 2 View  07/21/2012  *RADIOLOGY REPORT*  Clinical Data: Short of breath  CHEST -  2 VIEW  Comparison: 12/08/2011  Findings: Patchy and ill-defined opacities throughout the right lung have developed.  Scarring at the left base.  Linear atelectasis at the right base.  No pneumothorax.  Upper normal heart size.  Postoperative changes.  Focal airspace disease at the posterior right lung base.  No pneumothorax.  Osteopenia.  IMPRESSION: Patchy airspace and ill-defined opacities within the right lung as described.  This is nonspecific and may reflect an inflammatory process.  Other causes of airspace disease should be considered.   Original Report Authenticated By: Jolaine Click, M.D.    BNP (last 3 results)  Recent Labs  07/21/12 0900  PROBNP 1143.0*    ASSESSMENT / PLAN:  Dyspnea >> likely related to acute diastolic heart failure, AECOPD, and acute on chronic hypoxic respiratory failure.  AECOPD. No evidence for respiratory infection as trigger. Plan: -Continue spiriva, dulera -Change albuterol to prn  -Can likely start to wean off systemic steroids from 4/11  Acute on chronic hypoxic respiratory failure. Plan: -Continue supplemental oxygen 24/7 -Will need to assess ambulatory oximetry prior to d/c home to determine optimal home oxygen settings -Will need to arrange for overnight oximetry as outpt >> explained to him how nocturnal hypoxia can affect his cardiac function  Pulmonary infiltrates on CXR with elevated BNP from 4/09 likely from acute diastolic heart failure. Improved with diuresis. Plan: -F/u  CXR, BNP 4/11 -Lasix, blood pressure control per primary team  Hx of OSA >> non-compliant with CPAP as outpt. Plan: -Further assess as outpt >> explained rationale for using CPAP and how this is different from using supplemental oxygen   Coralyn Helling, MD [facility] 07/22/2012, 8:17 AM Pager:  [phone] After 3pm call: [phone]

## 2012-07-22 NOTE — Care Management (Signed)
Cm spoke with patient  With daughter-n-law present at bedside concerning discharge planning. Pt states currently living at home alone. Per pt dc plan is to discharge home. Cm informed pt of protocol of Physical Therapy to evaluate pt for dc needs.  Per pt would like [facility] upon discharge. Will continue to follow for further dc plans.   Roxy Manns Narya Beavin,RN,BSN [phone]

## 2012-07-22 NOTE — Progress Notes (Signed)
Subjective: Annette Stable is doing well. Breathing seems to be quieting down nicely. Stool seems to be a bigger issue at this time. He is frustrated regarding his loss of independence to include not checked his breathing and diarrhea but more important his vision. We had a long discussion about taking one day at time and perhaps looking for some rehabilitation prior to ultimate discharge home  Objective: Vital signs in last 24 hours: Temp:  [97.7 F (36.5 C)-97.9 F (36.6 C)] 97.7 F (36.5 C) (04/10 0600) Pulse Rate:  [66-89] 75 (04/10 0600) Resp:  [16-22] 19 (04/10 0600) BP: (113-157)/(54-72) 157/72 mmHg (04/10 0600) SpO2:  [84 %-97 %] 96 % (04/10 0600) Weight:  [80.1 kg (176 lb 9.4 oz)] 80.1 kg (176 lb 9.4 oz) (04/09 1618) Weight change:   CBG (last 3)  No results found for this basename: GLUCAP,  in the last 72 hours  Intake/Output from previous day: 04/09 0701 - 04/10 0700 In: 640 [P.O.:640] Out: 800 [Urine:800]  Physical Exam: Patient is awake alert no distress. He is breathing much easier wearing his oxygen lying supine. No JVD or bruits. Good facial symmetry. Lungs clear. Cardiovascular exam regular rate and rhythm with 2/6 systolic ejection murmur. Abdomen is benign. No peripheral edema.   Lab Results:  Recent Labs  07/21/12 0900  NA 141  K 3.2*  CL 103  CO2 29  GLUCOSE 107*  BUN 21  CREATININE 0.87  CALCIUM 9.0    Recent Labs  07/21/12 0900  AST 16  ALT 11  ALKPHOS 53  BILITOT 0.4  PROT 7.0  ALBUMIN 3.0*    Recent Labs  07/21/12 0900  WBC 6.2  NEUTROABS 4.9  HGB 12.1*  HCT 37.8*  MCV 88.5  PLT 185   Lab Results  Component Value Date   INR 1.07 07/21/2012   INR 1.01 09/19/2009   No results found for this basename: CKTOTAL, CKMB, CKMBINDEX, TROPONINI,  in the last 72 hours No results found for this basename: TSH, T4TOTAL, FREET3, T3FREE, THYROIDAB,  in the last 72 hours No results found for this basename: VITAMINB12, FOLATE, FERRITIN, TIBC, IRON,  RETICCTPCT,  in the last 72 hours  Studies/Results: Dg Chest 2 View  07/21/2012  *RADIOLOGY REPORT*  Clinical Data: Short of breath  CHEST - 2 VIEW  Comparison: 12/08/2011  Findings: Patchy and ill-defined opacities throughout the right lung have developed.  Scarring at the left base.  Linear atelectasis at the right base.  No pneumothorax.  Upper normal heart size.  Postoperative changes.  Focal airspace disease at the posterior right lung base.  No pneumothorax.  Osteopenia.  IMPRESSION: Patchy airspace and ill-defined opacities within the right lung as described.  This is nonspecific and may reflect an inflammatory process.  Other causes of airspace disease should be considered.   Original Report Authenticated By: Jolaine Click, M.D.      Assessment/Plan: #1 COPD exacerbation currently on steroids as well as bronchodilator settling down nicely  #2 chronic hypoxemia on home oxygen  #3 coronary artery disease stable well compensated   #4 aortic valve disease status post Bovie and valve replacement well compensated  #5 chronic diarrhea or seen by Dr. Madilyn Fireman in the past extensive workup not clear but this is obviously a chronic problem. Will send a stool for C. difficile but doubt. We'll initiate empiric Questran. We'll contact Dr. Madilyn Fireman office for further. Not certain whether he is had any type of trial of metronidazole or mucosal evaluation   LOS: 1 day  Anahla Bevis A 07/22/2012, 7:09 AM

## 2012-07-23 ENCOUNTER — Inpatient Hospital Stay (HOSPITAL_COMMUNITY): Payer: Medicare Other

## 2012-07-23 LAB — BASIC METABOLIC PANEL
CO2: 32 mEq/L (ref 19–32)
Calcium: 9 mg/dL (ref 8.4–10.5)
Creatinine, Ser: 0.87 mg/dL (ref 0.50–1.35)
Glucose, Bld: 140 mg/dL — ABNORMAL HIGH (ref 70–99)
Sodium: 144 mEq/L (ref 135–145)

## 2012-07-23 MED ORDER — POTASSIUM CHLORIDE IN NACL 40-0.9 MEQ/L-% IV SOLN
INTRAVENOUS | Status: DC
  Administered 2012-07-23: 07:00:00 via INTRAVENOUS
  Filled 2012-07-23: qty 1000

## 2012-07-23 MED ORDER — PREDNISONE 20 MG PO TABS
40.0000 mg | ORAL_TABLET | Freq: Every day | ORAL | Status: DC
  Administered 2012-07-23 – 2012-07-26 (×4): 40 mg via ORAL
  Filled 2012-07-23 (×5): qty 2

## 2012-07-23 MED ORDER — POTASSIUM CHLORIDE CRYS ER 20 MEQ PO TBCR
40.0000 meq | EXTENDED_RELEASE_TABLET | Freq: Once | ORAL | Status: AC
  Administered 2012-07-23: 40 meq via ORAL
  Filled 2012-07-23: qty 2

## 2012-07-23 MED ORDER — SACCHAROMYCES BOULARDII 250 MG PO CAPS
500.0000 mg | ORAL_CAPSULE | Freq: Two times a day (BID) | ORAL | Status: DC
  Administered 2012-07-23 – 2012-07-26 (×6): 500 mg via ORAL
  Filled 2012-07-23 (×7): qty 2

## 2012-07-23 MED ORDER — METHYLPREDNISOLONE SODIUM SUCC 125 MG IJ SOLR
60.0000 mg | Freq: Two times a day (BID) | INTRAMUSCULAR | Status: DC
  Filled 2012-07-23: qty 0.96

## 2012-07-23 MED ORDER — POTASSIUM CHLORIDE 10 MEQ/100ML IV SOLN
10.0000 meq | INTRAVENOUS | Status: AC
  Administered 2012-07-23 (×4): 10 meq via INTRAVENOUS
  Filled 2012-07-23 (×4): qty 100

## 2012-07-23 MED ORDER — POTASSIUM CHLORIDE CRYS ER 20 MEQ PO TBCR
40.0000 meq | EXTENDED_RELEASE_TABLET | Freq: Two times a day (BID) | ORAL | Status: DC
  Administered 2012-07-23 – 2012-07-26 (×7): 40 meq via ORAL
  Filled 2012-07-23 (×8): qty 2

## 2012-07-23 NOTE — Progress Notes (Signed)
Notified by laboratory around 5:45 this morning regarding a potassium level of 2.6. Paged on call for Dr. Jacky Kindle. Spoke with Dr. Timothy Lasso. Please see new orders.

## 2012-07-23 NOTE — Progress Notes (Signed)
PULMONARY  / CRITICAL CARE MEDICINE  Name: [name] [name] MRN: [id number] DOB: [birth year]    ADMISSION DATE:  07/21/2012 CONSULTATION DATE:  07/21/2012  REFERRING MD :  Dr. Jacky Kindle  CHIEF COMPLAINT:  Increasing shortness of breath  BRIEF PATIENT DESCRIPTION: 79 yo male former smoker admitted 4/9  with 2 months diarrhea, weakness, and progressive dyspnea.  Noted to have hypoxia, elevated BNP, and ASD on CXR in ER.  PCCM consulted.  Followed by Dr. Jetty Duhamel as outpt.  PMhx of COPD on home oxygen, OSA, CAD s/p CABG, s/p bovine aortic valve, Dysphagia, prostate cancer  PFT 07/30/05 >> FEV1 1.89 (71%), FEV1% 60, TLC 5.55 (84%), DLCO 73%, +BD  SIGNIFICANT EVENTS:   STUDIES:  4/7 - LE Doppler >>no evidence of SVT/DVT  LINES / TUBES:   CULTURES: 4/9 Sputum>>> 4/9 C diff >>>neg  ANTIBIOTICS: 4/9 Flagyl >>>  SUBJECTIVE:  Afebrile Feels breathing is much better.  Denies cough or chest congestion.  Notes wheezing prior to coming to hospital.     VITAL SIGNS: Temp:  [98 F (36.7 C)-98.5 F (36.9 C)] 98.5 F (36.9 C) (04/11 0443) Pulse Rate:  [68-72] 72 (04/11 0443) Resp:  [16-18] 18 (04/11 0443) BP: (119-138)/(51-77) 132/51 mmHg (04/11 0443) SpO2:  [92 %-94 %] 93 % (04/11 0443)  PHYSICAL EXAMINATION: General: No distress Neuro: normal strength HEENT: no sinus tenderness Cardiovascular:  s1s2 with SEM, regular Lungs: good air entry b/l, no wheeze Abdomen:  Soft, non tender Musculoskeletal:  No edema Skin:  No rashes   Recent Labs Lab 07/21/12 0900 07/23/12 0409  NA 141 144  K 3.2* 2.6*  CL 103 104  CO2 29 32  BUN 21 31*  CREATININE 0.87 0.87  GLUCOSE 107* 140*    Recent Labs Lab 07/21/12 0900  HGB 12.1*  HCT 37.8*  WBC 6.2  PLT 185   Dg Chest 2 View  07/23/2012  *RADIOLOGY REPORT*  Clinical Data: History of pulmonary infiltrates.  Follow-up. Shortness of breath.  CHEST - 2 VIEW  Comparison: 07/21/2012.  Findings: There is mild cardiac silhouette  enlargement. Ectasia and nonaneurysmal calcification of the thoracic aorta are seen. The patient has undergone previous median sternotomy and coronary artery bypass grafting. Valvular replacement procedure has also been performed.  There is generalized hyperinflation configuration with flattening of diaphragm on lateral image consistent with COPD.  There is slight chronic blunting of the left costophrenic angle with minimal fibrotic change in the left base.  No acute left lung infiltrates are seen.  Patchy infiltrative densities are seen in the right perihilar region, right midlung area and in the right base on the PA image. There is slight interval increase in the coalescent infiltrate density in the right perihilar region extending into the right upper lobe. There is some posterior basilar infiltrative density and atelectasis seen on lateral image.  This inferior density is slightly less prominent on the previous study.  There are osteophytes in the spine.  Hardware from prior posterior fusion surgery is seen on the inferior edge of the lateral image.  IMPRESSION: Mild cardiac silhouette enlargement.  Post CABG.  Hyperinflation consistent with COPD.Patchy infiltrative densities are seen in the right perihilar region, right midlung area and in the right base on the PA image.  There is slight interval increase in the coalescent infiltrate density in the right perihilar region extending into the right upper lobe.  There is some posterior basilar infiltrative density and atelectasis seen on lateral image.  This inferior density is  slightly less prominent on the previous study.   Original Report Authenticated By: Onalee Hua Call    BNP (last 3 results)  Recent Labs  07/21/12 0900 07/23/12 0409  PROBNP 1143.0* 1847.0*    ASSESSMENT / PLAN:  Dyspnea >> likely related to acute diastolic heart failure, AECOPD, and acute on chronic hypoxic respiratory failure.  AECOPD. OSA -He reports that he does not always use  his oxygen at night.  He has not been using his CPAP. No evidence for respiratory infection as trigger. Plan: -Continue spiriva, dulera -Change albuterol to prn  -PO prednisone 40 mg -can taper over 2 weeks to off  Acute on chronic hypoxic respiratory failure. Plan: -Continue supplemental oxygen 24/7 -Will need to assess ambulatory oximetry prior to d/c home to determine optimal home oxygen settings   Pulmonary infiltrates on CXR with elevated BNP from 4/09 likely from acute diastolic heart failure. Improved with diuresis. Plan: -F/u CXR, BNP 4/11 -Lasix, blood pressure control per primary team - dc IVFs  Hx of OSA >> non-compliant with CPAP as outpt. Plan: --Will need overnight oximetry as outpt - he will call for appt with Dr Maple Hudson on dc   PCCM to sign off  [name],RAKESH V.   07/23/2012, 10:54 AM

## 2012-07-23 NOTE — Progress Notes (Signed)
Subjective: Patient is doing well beyond the diarrhea. The diarrhea right now is the main issues is frustrating him and causing him to be a bit emotional. He did sense that maybe things are firming up keeping in mind we just started some interventions yesterday. Breathing is much better he claims is best it's been in months still on steroids and bronchodilators.  Objective: Vital signs in last 24 hours: Temp:  [98 F (36.7 C)-98.5 F (36.9 C)] 98.5 F (36.9 C) (04/11 0443) Pulse Rate:  [68-72] 72 (04/11 0443) Resp:  [16-18] 18 (04/11 0443) BP: (119-138)/(51-77) 132/51 mmHg (04/11 0443) SpO2:  [92 %-94 %] 93 % (04/11 0443) Weight change:   CBG (last 3)  No results found for this basename: GLUCAP,  in the last 72 hours  Intake/Output from previous day: 04/10 0701 - 04/11 0700 In: 760 [P.O.:760] Out: 1001 [Urine:1000; Stool:1]  Physical Exam: He is awake alert interactive wearing his oxygen. Good facial symmetry. Coloring is good. No air hunger. No JVD or bruits. Lungs clear. Cardiovascular exam regular rate and rhythm. Abdomen is benign. Trace edema. Intact pulses. Neurologic exam is normal.   Lab Results:  Recent Labs  07/21/12 0900 07/23/12 0409  NA 141 144  K 3.2* 2.6*  CL 103 104  CO2 29 32  GLUCOSE 107* 140*  BUN 21 31*  CREATININE 0.87 0.87  CALCIUM 9.0 9.0    Recent Labs  07/21/12 0900  AST 16  ALT 11  ALKPHOS 53  BILITOT 0.4  PROT 7.0  ALBUMIN 3.0*    Recent Labs  07/21/12 0900  WBC 6.2  NEUTROABS 4.9  HGB 12.1*  HCT 37.8*  MCV 88.5  PLT 185   Lab Results  Component Value Date   INR 1.07 07/21/2012   INR 1.01 09/19/2009   No results found for this basename: CKTOTAL, CKMB, CKMBINDEX, TROPONINI,  in the last 72 hours No results found for this basename: TSH, T4TOTAL, FREET3, T3FREE, THYROIDAB,  in the last 72 hours No results found for this basename: VITAMINB12, FOLATE, FERRITIN, TIBC, IRON, RETICCTPCT,  in the last 72  hours  Studies/Results: Dg Chest 2 View  07/21/2012  *RADIOLOGY REPORT*  Clinical Data: Short of breath  CHEST - 2 VIEW  Comparison: 12/08/2011  Findings: Patchy and ill-defined opacities throughout the right lung have developed.  Scarring at the left base.  Linear atelectasis at the right base.  No pneumothorax.  Upper normal heart size.  Postoperative changes.  Focal airspace disease at the posterior right lung base.  No pneumothorax.  Osteopenia.  IMPRESSION: Patchy airspace and ill-defined opacities within the right lung as described.  This is nonspecific and may reflect an inflammatory process.  Other causes of airspace disease should be considered.   Original Report Authenticated By: Jolaine Click, M.D.      Assessment/Plan: #1 COPD exacerbation currently on steroids as well as bronchodilator settling down nicely  #2 chronic hypoxemia on home oxygen  #3 coronary artery disease stable well compensated  #4 aortic valve disease status post Bovie and valve replacement well compensated  #5 chronic diarrhea or seen by Dr. Madilyn Fireman in the past extensive workup not clear but this is obviously a chronic problem. Will send a stool for C. difficile but doubt. We'll initiate empiric Questran. We'll contact Dr. Madilyn Fireman office for further. Not certain whether he is had any type of trial of metronidazole or mucosal evaluation  #6 hypokalemia replaced      LOS: 2 days   Kylei Purington A  07/23/2012, 7:18 AM

## 2012-07-23 NOTE — Evaluation (Signed)
Physical Therapy Evaluation Patient Details Name: [name] [name] MRN: [id number] DOB: 07/12/1920 Today's Date: 07/23/2012 Time: 7829-5621 PT Time Calculation (min): 25 min  PT Assessment / Plan / Recommendation Clinical Impression  Pt. is 79 yo male admitted 409/14 with COPD exacerbation, having diarhhea since admission. Pt. lives alone. Pt. will benefit from PT while inacute care. recommend SNF.    PT Assessment  Patient needs continued PT services    Follow Up Recommendations  SNF    Does the patient have the potential to tolerate intense rehabilitation      Barriers to Discharge Decreased caregiver support      Equipment Recommendations  None recommended by PT    Recommendations for Other Services     Frequency Min 3X/week    Precautions / Restrictions Precautions Precautions: Fall Precaution Comments: on O2 Restrictions Weight Bearing Restrictions: No   Pertinent Vitals/Pain sats on 4 l dropped to 85.       Mobility  Bed Mobility Bed Mobility: Supine to Sit;Sit to Supine Supine to Sit: 5: Supervision Sit to Supine: 5: Supervision Transfers Sit to Stand: 4: Min assist;From bed;From chair/3-in-1;Without upper extremity assist Details for Transfer Assistance: cues for hand placement; pt needed a couple of tries to get up from 3:1 Ambulation/Gait Ambulation/Gait Assistance: 1: +2 Total assist Ambulation/Gait: Patient Percentage: 70% Ambulation Distance (Feet): 200 Feet Assistive device: Rolling walker Ambulation/Gait Assistance Details: cues for position inside RW. slow pace. Gait velocity: fast    Exercises     PT Diagnosis: Difficulty walking;Generalized weakness  PT Problem List: Decreased strength;Decreased activity tolerance;Decreased balance;Decreased mobility;Decreased coordination;Decreased knowledge of use of DME;Decreased safety awareness;Decreased knowledge of precautions PT Treatment Interventions: DME instruction;Gait training;Functional  mobility training;Therapeutic activities;Therapeutic exercise;Patient/family education   PT Goals Acute Rehab PT Goals PT Goal Formulation: With patient/family Time For Goal Achievement: 08/06/12 Potential to Achieve Goals: Good Pt will go Supine/Side to Sit: Independently PT Goal: Supine/Side to Sit - Progress: Goal set today Pt will go Sit to Supine/Side: Independently PT Goal: Sit to Supine/Side - Progress: Goal set today Pt will go Sit to Stand: with modified independence PT Goal: Sit to Stand - Progress: Goal set today Pt will go Stand to Sit: with modified independence PT Goal: Stand to Sit - Progress: Goal set today Pt will Ambulate: >150 feet;with supervision;with least restrictive assistive device PT Goal: Ambulate - Progress: Goal set today  Visit Information  Last PT Received On: 07/23/12 Assistance Needed: +2 (for equipment.) PT/OT Co-Evaluation/Treatment: Yes    Subjective Data  Subjective: I will do what they tell me. Patient Stated Goal: to get over this.   Prior Functioning  Home Living Lives With: Alone Type of Home: House Home Access: Stairs to enter Entrance Stairs-Number of Steps: 1 ste back--doesn't use front Home Layout: One level (doesn't use basement) Bathroom Shower/Tub: Health visitor: Standard Home Adaptive Equipment: Shower chair with back Additional Comments: can use sink for support Prior Function Level of Independence: Independent Comments: help for housework and groceries Communication Communication: No difficulties    Cognition  Cognition Overall Cognitive Status: Appears within functional limits for tasks assessed/performed Arousal/Alertness: Awake/alert Orientation Level: Appears intact for tasks assessed Behavior During Session: [facility] for tasks performed    Extremity/Trunk Assessment Right Upper Extremity Assessment RUE ROM/Strength/Tone: [facility] for tasks assessed Left Upper Extremity Assessment LUE  ROM/Strength/Tone: [facility] for tasks assessed Right Lower Extremity Assessment RLE ROM/Strength/Tone: [facility] for tasks assessed Left Lower Extremity Assessment LLE ROM/Strength/Tone: [facility] for tasks assessed   Balance  End of Session PT - End of Session Equipment Utilized During Treatment: Gait belt Activity Tolerance: Patient tolerated treatment well Patient left: in bed;with call bell/phone within reach;with family/visitor present Nurse Communication: Mobility status  GP     [name] 07/23/2012, 5:12 PM

## 2012-07-23 NOTE — Evaluation (Signed)
Occupational Therapy Evaluation Patient Details Name: [name] [name] MRN: [id number] DOB: March 22, 1921 Today's Date: 07/23/2012 Time: 0272-5366 OT Time Calculation (min): 23 min  OT Assessment / Plan / Recommendation Clinical Impression  This 79 year old man was admitted fro COPD exacerbation.  He has been having diarrhea since admission also.  Pt will benefit from continued OT to reach supervision level in acute.    OT Assessment  Patient needs continued OT Services    Follow Up Recommendations  SNF    Barriers to Discharge Decreased caregiver support    Equipment Recommendations  3 in 1 bedside comode (possibly)    Recommendations for Other Services    Frequency  Min 2X/week    Precautions / Restrictions Precautions Precautions: Fall Restrictions Weight Bearing Restrictions: No   Pertinent Vitals/Pain No pain.  After walking, sats 86% on  4 liters, recovered to 95% after brief rest and pursed lip breathing    ADL  Grooming: Set up Where Assessed - Grooming: Unsupported sitting Upper Body Bathing: Set up Where Assessed - Upper Body Bathing: Unsupported sitting Lower Body Bathing: Minimal assistance Where Assessed - Lower Body Bathing: Supported sit to stand Upper Body Dressing: Minimal assistance (iv) Where Assessed - Upper Body Dressing: Supported sit to stand Lower Body Dressing: Minimal assistance Where Assessed - Lower Body Dressing: Supported sit to Pharmacist, hospital: Minimal Dentist Method: Surveyor, minerals: Materials engineer and Hygiene: Minimal assistance Where Assessed - Engineer, mining and Hygiene: Sit to stand from 3-in-1 or toilet Equipment Used: Rolling walker Transfers/Ambulation Related to ADLs: +2 for ambulation in the hall--for lines.  Had made makeshift incontinence brief due to diarrhea ADL Comments: Pt lost balance posteriorly during clothing management.  Educated on pursed lip breathing.  Began to review energy conservation.  Pt states that he does sit for adls and takes rest breaks.     OT Diagnosis: Generalized weakness  OT Problem List: Decreased strength;Decreased activity tolerance;Impaired balance (sitting and/or standing);Decreased knowledge of use of DME or AE;Cardiopulmonary status limiting activity;Impaired vision/perception OT Treatment Interventions: Self-care/ADL training;DME and/or AE instruction;Therapeutic activities;Patient/family education;Balance training;Energy conservation   OT Goals Acute Rehab OT Goals OT Goal Formulation: With patient ADL Goals Pt Will Perform Lower Body Bathing: with supervision;Sit to stand from chair ADL Goal: Lower Body Bathing - Progress: Goal set today Pt Will Perform Lower Body Dressing: with supervision;Sit to stand from chair ADL Goal: Lower Body Dressing - Progress: Goal set today Pt Will Transfer to Toilet: with supervision;Ambulation;3-in-1 ADL Goal: Toilet Transfer - Progress: Goal set today Pt Will Perform Toileting - Clothing Manipulation: with supervision;Sitting on 3-in-1 or toilet;Standing ADL Goal: Toileting - Clothing Manipulation - Progress: Goal set today Pt Will Perform Toileting - Hygiene: with set-up;Sit to stand from 3-in-1/toilet ADL Goal: Toileting - Hygiene - Progress: Goal set today Miscellaneous OT Goals Miscellaneous OT Goal #1: Pt will demonstrate pursed lip breathing when sob without cues OT Goal: Miscellaneous Goal #1 - Progress: Goal set today  Visit Information  Last OT Received On: 07/23/12 Assistance Needed: +2 (lines; 02) PT/OT Co-Evaluation/Treatment: Yes    Subjective Data  Subjective: You came just in time Patient Stated Goal: None stated.  Agreeable to OT/PT   Prior Functioning     Home Living Lives With: Alone Type of Home: House Home Access: Stairs to enter Entrance Stairs-Number of Steps: 1 ste back--doesn't use front Home Layout: One  level (doesn't use basement) Bathroom Shower/Tub: Heritage manager  Toilet: Standard Home Adaptive Equipment: Shower chair with back Additional Comments: can use sink for support Prior Function Level of Independence: Independent Comments: help for housework and groceries Communication Communication: No difficulties         Vision/Perception     Copywriter, advertising Overall Cognitive Status: Appears within functional limits for tasks assessed/performed Arousal/Alertness: Awake/alert Orientation Level: Appears intact for tasks assessed Behavior During Session: [name] for tasks performed    Extremity/Trunk Assessment Right Upper Extremity Assessment RUE ROM/Strength/Tone: [name] for tasks assessed Left Upper Extremity Assessment LUE ROM/Strength/Tone: [name] for tasks assessed     Mobility Bed Mobility Bed Mobility: Supine to Sit;Sit to Supine Supine to Sit: 5: Supervision Sit to Supine: 5: Supervision Transfers Transfers: Sit to Stand Sit to Stand: 4: Min assist;From bed;From chair/3-in-1;Without upper extremity assist Details for Transfer Assistance: cues for hand placement; pt needed a couple of tries to get up from 3:1     Exercise     Balance     End of Session OT - End of Session Activity Tolerance: Patient tolerated treatment well Patient left: in bed;with call bell/phone within reach  GO     [facility] 07/23/2012, 4:50 PM Marica Otter, OTR/L [phone] 07/23/2012

## 2012-07-23 NOTE — Consult Note (Signed)
Referring Provider: Dr. Jacky Kindle Primary Care Physician:  Minda Meo, MD Primary Gastroenterologist:  Dr. Madilyn Fireman  Reason for Consultation:  Intractable diarrhea  HPI: [name] [name] is a 79 y.o. male admitted to the hospital 2 days ago with a COPD exacerbation. However, he is also having severe diarrhea.  The patient has a long-standing history of diarrhea, going back approximately 5 years. Prior to that time, he did not have problems with diarrhea.   The diarrhea occurs most days out of the month, probably 20 out of 30 days, although until recently (until the past week), it would only be 1 episode per day of a "blowout," after which he might have some additional stool seepage.   This problem has been of sufficient severity to cause him to limit certain activities, such as social engagements. Ordinarily, he does not get nocturnal diarrhea, and I don't think there has been any problem with bleeding, nor significant abdominal cramps.  He has been followed by my partner, Dr. Dorena Cookey, for this problem. Colonoscopy with random mucosal biopsies in August of 2009 was negative for evidence of microscopic colitis. Trials of sucralfate, cholestyramine (for possible postcholecystectomy diarrhea), Imodium, and more recently tincture of opium have all been unsuccessful.  As far as the patient is aware, he has not yet been tried probiotics, Lotronex, or empiric intraluminal steroids. He is extremely frustrated with problem, and has to wear Depends all the time, primarily because of fear of leakage or seepage, rather than gross fecal incontinence.   Past Medical History  Diagnosis Date  . COPD (chronic obstructive pulmonary disease)   . Sleep apnea   . Hyperlipidemia   . Hypertension   . Cancer     prostate cancer    Past Surgical History  Procedure Laterality Date  . Aortic valve replacement      surgery prior to 2004, bovine  . Cholecystectomy    . Shoulder surgery  June 2011    left   . Prostate surgery      prostate removed    Prior to Admission medications   Medication Sig Start Date End Date Taking? Authorizing Provider  amLODipine (NORVASC) 5 MG tablet Take 10 mg by mouth daily.    Yes Historical Provider, MD  aspirin 81 MG tablet Take 81 mg by mouth daily.     Yes Historical Provider, MD  atenolol (TENORMIN) 50 MG tablet Take 50 mg by mouth daily.     Yes Historical Provider, MD  diphenoxylate-atropine (LOMOTIL) 2.5-0.025 MG per tablet Take 1 tablet by mouth 4 (four) times daily as needed for diarrhea or loose stools.   Yes Historical Provider, MD  felodipine (PLENDIL) 10 MG 24 hr tablet Take 10 mg by mouth daily.     Yes Historical Provider, MD  hydrochlorothiazide 25 MG tablet Take 25 mg by mouth daily.     Yes Historical Provider, MD  HYDROcodone-acetaminophen (NORCO/VICODIN) 5-325 MG per tablet Take 1 tablet by mouth every 6 (six) hours as needed for pain.   Yes Historical Provider, MD  mometasone-formoterol (DULERA) 100-5 MCG/ACT AERO Inhale 2 puffs into the lungs 2 (two) times daily. Rinse mouth 12/08/11 12/07/12 Yes Waymon Budge, MD  omeprazole (PRILOSEC) 20 MG capsule Take 20 mg by mouth daily.     Yes Historical Provider, MD  paregoric 2 MG/5ML solution Take 5-10 mLs by mouth 4 (four) times daily as needed for diarrhea or loose stools.   Yes Historical Provider, MD  simvastatin (ZOCOR) 80 MG tablet Take  80 mg by mouth at bedtime.     Yes Historical Provider, MD  zolpidem (AMBIEN) 5 MG tablet Take 5 mg by mouth at bedtime as needed for sleep.   Yes Historical Provider, MD  clonazePAM (KLONOPIN) 0.5 MG tablet Take 1 or 2 at bedtime as needed for sleep 07/03/10 01/07/12  Waymon Budge, MD  zolpidem (AMBIEN) 10 MG tablet Take 1 tablet (10 mg total) by mouth at bedtime as needed for sleep. 07/03/10 08/02/10  Waymon Budge, MD    Current Facility-Administered Medications  Medication Dose Route Frequency Provider Last Rate Last Dose  . acetaminophen (TYLENOL)  tablet 650 mg  650 mg Oral Q6H PRN Minda Meo, MD       Or  . acetaminophen (TYLENOL) suppository 650 mg  650 mg Rectal Q6H PRN Minda Meo, MD      . albuterol (PROVENTIL HFA;VENTOLIN HFA) 108 (90 BASE) MCG/ACT inhaler 2 puff  2 puff Inhalation Q3H PRN Coralyn Helling, MD      . amLODipine (NORVASC) tablet 10 mg  10 mg Oral Daily Minda Meo, MD   10 mg at 07/23/12 0930  . antiseptic oral rinse (BIOTENE) solution 15 mL  15 mL Mouth Rinse BID Minda Meo, MD   15 mL at 07/23/12 0800  . aspirin chewable tablet 81 mg  81 mg Oral Daily Minda Meo, MD   81 mg at 07/23/12 0930  . atenolol (TENORMIN) tablet 50 mg  50 mg Oral Daily Minda Meo, MD   50 mg at 07/23/12 0930  . atorvastatin (LIPITOR) tablet 40 mg  40 mg Oral QPM Minda Meo, MD   40 mg at 07/23/12 1627  . cholestyramine light (PREVALITE) packet 4 g  4 g Oral Q12H Minda Meo, MD   4 g at 07/23/12 0929  . enoxaparin (LOVENOX) injection 40 mg  40 mg Subcutaneous QHS Minda Meo, MD   40 mg at 07/22/12 2149  . furosemide (LASIX) tablet 20 mg  20 mg Oral Daily Minda Meo, MD   20 mg at 07/23/12 0930  . HYDROcodone-acetaminophen (NORCO/VICODIN) 5-325 MG per tablet 1 tablet  1 tablet Oral Q6H PRN Minda Meo, MD      . metroNIDAZOLE (FLAGYL) tablet 500 mg  500 mg Oral Q8H Minda Meo, MD   500 mg at 07/23/12 1337  . mometasone-formoterol (DULERA) 100-5 MCG/ACT inhaler 2 puff  2 puff Inhalation BID Minda Meo, MD   2 puff at 07/23/12 [phone]  . pantoprazole (PROTONIX) EC tablet 40 mg  40 mg Oral Daily Minda Meo, MD   40 mg at 07/23/12 0930  . potassium chloride SA (K-DUR,KLOR-CON) CR tablet 40 mEq  40 mEq Oral BID Minda Meo, MD   40 mEq at 07/23/12 0930  . predniSONE (DELTASONE) tablet 40 mg  40 mg Oral Q breakfast Oretha Milch, MD   40 mg at 07/23/12 1143  . saccharomyces boulardii (FLORASTOR) capsule 500 mg  500 mg Oral BID Florencia Reasons, MD      .  tiotropium [facility]) inhalation capsule 18 mcg  18 mcg Inhalation Daily Minda Meo, MD   18 mcg at 07/23/12 0917  . zolpidem (AMBIEN) tablet 5 mg  5 mg Oral QHS PRN Minda Meo, MD        Allergies as of 07/21/2012  . (No Known Allergies)    Family History  Problem Relation Age of Onset  .  COPD Brother   . Stroke Father   . Diabetes Father     History   Social History  . Marital Status: Widowed    Spouse Name: N/A    Number of Children: N/A  . Years of Education: N/A   Occupational History  . Not on file.   Social History Main Topics  . Smoking status: Former Games developer  . Smokeless tobacco: Never Used  . Alcohol Use: No     Comment: Occasional Alcohol  . Drug Use: Not on file  . Sexually Active: Not Currently   Other Topics Concern  . Not on file   Social History Narrative  . No narrative on file    Review of Systems: See history of present illness  Physical Exam: Vital signs in last 24 hours: Temp:  [97.2 F (36.2 C)-98.5 F (36.9 C)] 97.2 F (36.2 C) (04/11 1501) Pulse Rate:  [65-72] 65 (04/11 1501) Resp:  [16-18] 18 (04/11 1501) BP: (119-137)/(51-77) 137/66 mmHg (04/11 1501) SpO2:  [92 %-96 %] 96 % (04/11 1501) Last BM Date: 07/22/12 General:   Alert,  Well-developed, well-nourished, pleasant and cooperative in NAD Head:  Normocephalic and atraumatic. Eyes:  Sclera clear, no icterus.    Lungs:  Clear throughout to auscultation but somewhat diminished breath sounds.   No wheezes, crackles, or rhonchi. No evident respiratory distress on oxygen. (Apparently his breathing was much more labored when he came in the hospital.) Heart:   Regular rate and rhythm; no murmurs, clicks, rubs,  or gallops. Abdomen:  Soft, nontender, no evident masses or distention.   Msk:   Symmetrical without gross deformities. Neurologic:  Alert and coherent;  grossly normal neurologically. Psych:   Alert and cooperative. Normal mood and affect.  Intake/Output from  previous day: 04/10 0701 - 04/11 0700 In: 760 [P.O.:760] Out: 1001 [Urine:1000; Stool:1] Intake/Output this shift: Total I/O In: 240 [P.O.:240] Out: -   Lab Results:  Recent Labs  07/21/12 0900  WBC 6.2  HGB 12.1*  HCT 37.8*  PLT 185   BMET  Recent Labs  07/21/12 0900 07/23/12 0409  NA 141 144  K 3.2* 2.6*  CL 103 104  CO2 29 32  GLUCOSE 107* 140*  BUN 21 31*  CREATININE 0.87 0.87  CALCIUM 9.0 9.0   LFT  Recent Labs  07/21/12 0900  PROT 7.0  ALBUMIN 3.0*  AST 16  ALT 11  ALKPHOS 53  BILITOT 0.4   PT/INR  Recent Labs  07/21/12 0900  LABPROT 13.8  INR 1.07    C-Diff Negative as of yesterday  Studies/Results: Dg Chest 2 View  07/23/2012  *RADIOLOGY REPORT*  Clinical Data: History of pulmonary infiltrates.  Follow-up. Shortness of breath.  CHEST - 2 VIEW  Comparison: 07/21/2012.  Findings: There is mild cardiac silhouette enlargement. Ectasia and nonaneurysmal calcification of the thoracic aorta are seen. The patient has undergone previous median sternotomy and coronary artery bypass grafting. Valvular replacement procedure has also been performed.  There is generalized hyperinflation configuration with flattening of diaphragm on lateral image consistent with COPD.  There is slight chronic blunting of the left costophrenic angle with minimal fibrotic change in the left base.  No acute left lung infiltrates are seen.  Patchy infiltrative densities are seen in the right perihilar region, right midlung area and in the right base on the PA image. There is slight interval increase in the coalescent infiltrate density in the right perihilar region extending into the right upper lobe. There is some  posterior basilar infiltrative density and atelectasis seen on lateral image.  This inferior density is slightly less prominent on the previous study.  There are osteophytes in the spine.  Hardware from prior posterior fusion surgery is seen on the inferior edge of the  lateral image.  IMPRESSION: Mild cardiac silhouette enlargement.  Post CABG.  Hyperinflation consistent with COPD.Patchy infiltrative densities are seen in the right perihilar region, right midlung area and in the right base on the PA image.  There is slight interval increase in the coalescent infiltrate density in the right perihilar region extending into the right upper lobe.  There is some posterior basilar infiltrative density and atelectasis seen on lateral image.  This inferior density is slightly less prominent on the previous study.   Original Report Authenticated By: Onalee Hua Call     Impression: Diarrhea predominant functional bowel disease. Doubt celiac disease or microscopic colitis based on prior evaluation.  Plan: Check celiac antibody.  Trial of probiotic therapy. The patient and his daughter-in-law, who is at the bedside, understand that this could take a long time to work.   If that is not effective, I would consider a trial of Lotronex, and if that is not effective, perhaps an empiric trial of intraluminal budesonide (Uceris).  We briefly discussed the option of a colostomy, but the patient is probably not a good surgical candidate based on his COPD, and more especially, he is not interested in having such a radical solution to his problem, and I don't blame him.  Total time for today's visit, at the bedside, was approximately 30 minutes, not counting time spent reviewing office records.   LOS: 2 days   Khiara Shuping V  07/23/2012, 5:28 PM

## 2012-07-24 ENCOUNTER — Inpatient Hospital Stay (HOSPITAL_COMMUNITY): Payer: Medicare Other

## 2012-07-24 DIAGNOSIS — R197 Diarrhea, unspecified: Secondary | ICD-10-CM | POA: Diagnosis present

## 2012-07-24 LAB — BASIC METABOLIC PANEL
CO2: 28 mEq/L (ref 19–32)
Calcium: 8.7 mg/dL (ref 8.4–10.5)
Chloride: 114 mEq/L — ABNORMAL HIGH (ref 96–112)
Glucose, Bld: 125 mg/dL — ABNORMAL HIGH (ref 70–99)
Sodium: 149 mEq/L — ABNORMAL HIGH (ref 135–145)

## 2012-07-24 MED ORDER — CHOLESTYRAMINE LIGHT 4 G PO PACK
4.0000 g | PACK | Freq: Two times a day (BID) | ORAL | Status: DC
  Administered 2012-07-24 – 2012-07-26 (×4): 4 g via ORAL
  Filled 2012-07-24 (×5): qty 1

## 2012-07-24 MED ORDER — PANCRELIPASE (LIP-PROT-AMYL) 12000-38000 UNITS PO CPEP
2.0000 | ORAL_CAPSULE | Freq: Three times a day (TID) | ORAL | Status: DC
  Administered 2012-07-24 – 2012-07-26 (×6): 2 via ORAL
  Filled 2012-07-24 (×9): qty 2

## 2012-07-24 MED ORDER — POTASSIUM CHLORIDE 10 MEQ/100ML IV SOLN
10.0000 meq | INTRAVENOUS | Status: AC
  Administered 2012-07-24 (×4): 10 meq via INTRAVENOUS
  Filled 2012-07-24 (×4): qty 100

## 2012-07-24 MED ORDER — VANCOMYCIN 50 MG/ML ORAL SOLUTION
500.0000 mg | ORAL | Status: DC
  Administered 2012-07-24 – 2012-07-26 (×9): 500 mg via ORAL
  Filled 2012-07-24 (×12): qty 10

## 2012-07-24 MED ORDER — IOHEXOL 300 MG/ML  SOLN
50.0000 mL | Freq: Once | INTRAMUSCULAR | Status: AC | PRN
  Administered 2012-07-24: 50 mL via ORAL

## 2012-07-24 NOTE — Progress Notes (Signed)
Clinical Social Work Department BRIEF PSYCHOSOCIAL ASSESSMENT 07/24/2012  Patient:  [name] [name], [name] [name]     Account Number:  [id number]     Admit date:  07/21/2012  Clinical Social Worker:  Doroteo Glassman  Date/Time:  07/24/2012 04:10 PM  Referred by:  Physician  Date Referred:  07/24/2012 Referred for  SNF Placement   Other Referral:   Interview type:  Patient Other interview type:   Daughter-in-law at bedside    PSYCHOSOCIAL DATA Living Status:  ALONE Admitted from facility:   Level of care:   Primary support name:  Jillyn Hidden Primary support relationship to patient:  CHILD, ADULT Degree of support available:   strong    CURRENT CONCERNS Current Concerns  Post-Acute Placement   Other Concerns:    SOCIAL WORK ASSESSMENT / PLAN Met with Pt and daughter-in-law to discuss d/c plans.    Pt stated that he is agreeable to SNF and gave CSW permission to begin the SNF search.    Answered questions re: the SNF process.    CSW thanked Pt and daughter-in-law for their time.   Assessment/plan status:   Other assessment/ plan:   Information/referral to community resources:   [facility] SNF list    PATIENT'S/FAMILY'S RESPONSE TO PLAN OF CARE: Pt and daughter-in-law thanked CSW for time and assistance.   Providence Crosby, LCSWA Clinical Social Work [phone]

## 2012-07-24 NOTE — Progress Notes (Signed)
Case discussed with Dr. Leonard Schwartz. will add stool for fecal fat and wbc's and consider repeat CT scan first to rule out any mass or obvious colitis or small bowel inflammation

## 2012-07-24 NOTE — Progress Notes (Signed)
[name] [name] 2:17 PM  Subjective: Patient is actually doing better today and his case was discussed with my partner Dr. Leonard Schwartz. as well as the patient and his daughter-in-law and he has no pain and no weight loss but he cannot say if he had been on any new medicines in the last 6 weeks that might be playing a role  Objective: Vital signs stable afebrile no acute distress abdomen is soft nontender hospital computer reviewed  Assessment: Diarrhea of questionable etiology in a patient with episodic chronic issues and multiple medical problems  Plan: Doubt microscopic colitis plan a role because prednisone would probably help and will await extra stool studies for WBCs and fat and proceed with an oral contrast only CT to rule out mass or obvious inflammation and consider flexible sigmoidoscopy on Monday or if diagnosis still in question or other workup and plans based on studies and all of the patient and his daughter-in-laws questions were answered [facility]

## 2012-07-24 NOTE — Progress Notes (Addendum)
Clinical Social Work Department CLINICAL SOCIAL WORK PLACEMENT NOTE 07/24/2012  Patient:  [name] [name], [name] [name]  Account Number:  [id number] Admit date:  07/21/2012  Clinical Social Worker:  Doroteo Glassman  Date/time:  07/24/2012 04:13 PM  Clinical Social Work is seeking post-discharge placement for this patient at the following level of care:   SKILLED NURSING   (*CSW will update this form in Epic as items are completed)   07/24/2012  Patient/family provided with [facility] Department of Clinical Social Work's list of facilities offering this level of care within the geographic area requested by the patient (or if unable, by the patient's family).  07/24/2012  Patient/family informed of their freedom to choose among providers that offer the needed level of care, that participate in Medicare, Medicaid or managed care program needed by the patient, have an available bed and are willing to accept the patient.  07/24/2012  Patient/family informed of [facility]' ownership interest in [facility], as well as of the fact that they are under no obligation to receive care at this facility.  PASARR submitted to E[employer] on 07/23/2012 PASARR number received from [employer] on 07/23/2012  FL2 transmitted to all facilities in geographic area requested by pt/family on  07/24/2012 FL2 transmitted to all facilities within larger geographic area on   Patient informed that his/her managed care company has contracts with or will negotiate with  certain facilities, including the following:     Patient/family informed of bed offers received:  07/26/2012 Patient chooses bed at [facility] Physician recommends and patient chooses bed at    Patient to be transferred to  on  [facility] on 07/26/2012 Patient to be transferred to facility by pt son via private vehicle  The following physician request were entered in Epic:   Additional  Comments:  Providence Crosby, LCSWA Clinical Social Work [phone] Lewis, MSW, [facility]  Clinical Social Work [phone]

## 2012-07-24 NOTE — Progress Notes (Signed)
Subjective: Still having a multitude of loose stools. Had 3 in one hour during the night. Back-end is raw. No abd pain, no nausea. Breathing OK  Objective: Vital signs in last 24 hours: Temp:  [97.2 F (36.2 C)-98.2 F (36.8 C)] 98.2 F (36.8 C) (04/12 0457) Pulse Rate:  [65-72] 72 (04/12 0457) Resp:  [18] 18 (04/12 0457) BP: (128-140)/(52-66) 140/66 mmHg (04/12 0457) SpO2:  [93 %-98 %] 93 % (04/12 0926)  Intake/Output from previous day: 04/11 0701 - 04/12 0700 In: 240 [P.O.:240] Out: -  Intake/Output this shift:    General: alert in no distress, face symmetric, neck supple. Lungs sl reduced. No wheeze, ht regular with sys murmur. abd non distended. BS's good to sl overactive, no masses, extrems no edema Awake, alert mentating well  Lab Results      Recent Labs  07/23/12 0409 07/24/12 0411  NA 144 149*  K 2.6* 3.2*  CL 104 114*  CO2 32 28  GLUCOSE 140* 125*  BUN 31* 35*  CREATININE 0.87 0.90  CALCIUM 9.0 8.7    Studies/Results: Dg Chest 2 View  07/23/2012  *RADIOLOGY REPORT*  Clinical Data: History of pulmonary infiltrates.  Follow-up. Shortness of breath.  CHEST - 2 VIEW  Comparison: 07/21/2012.  Findings: There is mild cardiac silhouette enlargement. Ectasia and nonaneurysmal calcification of the thoracic aorta are seen. The patient has undergone previous median sternotomy and coronary artery bypass grafting. Valvular replacement procedure has also been performed.  There is generalized hyperinflation configuration with flattening of diaphragm on lateral image consistent with COPD.  There is slight chronic blunting of the left costophrenic angle with minimal fibrotic change in the left base.  No acute left lung infiltrates are seen.  Patchy infiltrative densities are seen in the right perihilar region, right midlung area and in the right base on the PA image. There is slight interval increase in the coalescent infiltrate density in the right perihilar region extending into  the right upper lobe. There is some posterior basilar infiltrative density and atelectasis seen on lateral image.  This inferior density is slightly less prominent on the previous study.  There are osteophytes in the spine.  Hardware from prior posterior fusion surgery is seen on the inferior edge of the lateral image.  IMPRESSION: Mild cardiac silhouette enlargement.  Post CABG.  Hyperinflation consistent with COPD.Patchy infiltrative densities are seen in the right perihilar region, right midlung area and in the right base on the PA image.  There is slight interval increase in the coalescent infiltrate density in the right perihilar region extending into the right upper lobe.  There is some posterior basilar infiltrative density and atelectasis seen on lateral image.  This inferior density is slightly less prominent on the previous study.   Original Report Authenticated By: Onalee Hua Call     Scheduled Meds: . amLODipine  10 mg Oral Daily  . antiseptic oral rinse  15 mL Mouth Rinse BID  . aspirin  81 mg Oral Daily  . atenolol  50 mg Oral Daily  . atorvastatin  40 mg Oral QPM  . cholestyramine light  4 g Oral Q12H  . enoxaparin (LOVENOX) injection  40 mg Subcutaneous QHS  . furosemide  20 mg Oral Daily  . metroNIDAZOLE  500 mg Oral Q8H  . mometasone-formoterol  2 puff Inhalation BID  . pantoprazole  40 mg Oral Daily  . potassium chloride  10 mEq Intravenous Q1 Hr x 4  . potassium chloride  40 mEq Oral BID  .  predniSONE  40 mg Oral Q breakfast  . saccharomyces boulardii  500 mg Oral BID  . tiotropium  18 mcg Inhalation Daily   Continuous Infusions:  PRN Meds:acetaminophen, acetaminophen, albuterol, HYDROcodone-acetaminophen, zolpidem  Assessment/Plan: DIARRHEA: this has become a signficant issue. Cdiff negative but now on Flagyl. Also on probiotics and cholestyramine with minimal help. On steroids for lung dz ? Osmotic, ? Secretory ? Infectious. GI to see again. Likely needs a scope. Try  adding digestive enzymes. Change to PO VANCO COPD EXAC: doing well PNEUMONIA: off abx HYPERTENSION: BP OK HYPOKALEMIA: replace HYPERLIPIDEMIA: on Rx   LOS: 3 days   [name] [name] ALAN 07/24/2012, 10:26 AM

## 2012-07-25 LAB — BASIC METABOLIC PANEL
CO2: 30 mEq/L (ref 19–32)
Calcium: 8.2 mg/dL — ABNORMAL LOW (ref 8.4–10.5)
Chloride: 106 mEq/L (ref 96–112)
Glucose, Bld: 100 mg/dL — ABNORMAL HIGH (ref 70–99)
Sodium: 142 mEq/L (ref 135–145)

## 2012-07-25 NOTE — Progress Notes (Signed)
Subjective: Bowels are doing better. Did well all day, had one stool overnight. Breathing better.   Objective: Vital signs in last 24 hours: Temp:  [97.7 F (36.5 C)-98.9 F (37.2 C)] 98.9 F (37.2 C) (04/13 0528) Pulse Rate:  [68-74] 72 (04/13 0954) Resp:  [18] 18 (04/13 0528) BP: (119-143)/(52-70) 134/52 mmHg (04/13 0954) SpO2:  [96 %-97 %] 96 % (04/13 0912)  Intake/Output from previous day: 04/12 0701 - 04/13 0700 In: 640 [P.O.:240; IV Piggyback:400] Out: 400 [Urine:400] Intake/Output this shift:   Sitting up in no distress. Oxygen in place. Lungs: no wheeze, diminished. Ht Regular with sym murmur. abd soft NT Alert, clear speech   Lab Results    Recent Labs  07/24/12 0411 07/25/12 0430  NA 149* 142  K 3.2* 4.1  CL 114* 106  CO2 28 30  GLUCOSE 125* 100*  BUN 35* 19  CREATININE 0.90 0.69  CALCIUM 8.7 8.2*    Studies/Results: Ct Abdomen Pelvis Wo Contrast  07/24/2012  *RADIOLOGY REPORT*  Clinical Data: 79 year old male with lower abdominal pain. Persistent diarrhea.  Renal insufficiency precludes IV contrast.  CT ABDOMEN AND PELVIS WITHOUT CONTRAST  Technique:  Multidetector CT imaging of the abdomen and pelvis was performed following the standard protocol without intravenous contrast.  Comparison: CTs abdomen pelvis 09/11/2003 and earlier.  Findings: No pericardial or left pleural effusion.  Small layering right pleural effusion.  Scarring, bronchiectasis, and compressive atelectasis at the lung bases.  Postoperative changes to the lumbar spine.  Advanced the adjacent segment disease in the lower thoracic spine.  Osteopenia.  Sequelae of median sternotomy. No acute osseous abnormality identified.  Penile implant.  Surgical clips at the prostate bed such as from prostatectomy.  No pelvic free fluid.  Negative rectum.  Sigmoid diverticulosis with some indistinctness of the sigmoid wall.  No definite mesenteric stranding.  Oral contrast has reached the mid descending colon  which has a more normal appearance. Transverse colon, hepatic flexure, right colon, appendix, and distal small bowel are within normal limits.  No dilated small bowel loops.  Some small bowel loops demonstrate wall thickening. No small bowel mesenteric stranding identified.  Unremarkable noncontrast stomach and duodenum.  Gallbladder surgically absent.  Occasional tiny hypodense areas in the liver are too small to characterize but likely benign.  Spleen, pancreas, and adrenal glands are within normal limits.  Exophytic left renal upper pole mass measuring 3 cm has densitometry of a simple cyst.  Small bilateral nephrolithiasis or renal sinus calcified atherosclerosis.  No hydronephrosis.  Bilateral perinephric stranding probably is age related.  No hydroureter. Unremarkable bladder.  Severe calcified atherosclerosis of the aorta and its branches. Infrarenal enlargement of the aorta up to 34 mm diameter.  The proximal to the aortoiliac bifurcation there is an ulcerated plaque or small chronic dissection (coronal image 51).  Atherosclerosis and ectasia continues in the iliac arteries.  IMPRESSION: 1.  Sigmoid diverticulosis where mild diverticulitis cannot be excluded.  Remaining colon appears within normal limits. 2.  No bowel obstruction, but intermittent small bowel wall thickening might also reflect inflammatory enteritis.  Ischemic enteritis not favored at this time due to absent mesenteric stranding / fluid. 3.  Extensive atherosclerosis throughout the abdomen and pelvis. Infrarenal abdominal aorta up to 34 mm diameter. Recommend followup by [place] in 3 years.  This recommendation follows ACR consensus guidelines: White Paper of the ACR Incidental Findings Committee II on Vascular Findings.  J Am Coll Radiol 2013; 10:789-794. 4.  Small layering right pleural effusion.  Original Report Authenticated By: Erskine Speed, M.D.     Scheduled Meds: . amLODipine  10 mg Oral Daily  . antiseptic oral rinse  15 mL Mouth  Rinse BID  . aspirin  81 mg Oral Daily  . atenolol  50 mg Oral Daily  . atorvastatin  40 mg Oral QPM  . cholestyramine light  4 g Oral Q12H  . enoxaparin (LOVENOX) injection  40 mg Subcutaneous QHS  . furosemide  20 mg Oral Daily  . lipase/protease/amylase  2 capsule Oral TID AC  . mometasone-formoterol  2 puff Inhalation BID  . pantoprazole  40 mg Oral Daily  . potassium chloride  40 mEq Oral BID  . predniSONE  40 mg Oral Q breakfast  . saccharomyces boulardii  500 mg Oral BID  . tiotropium  18 mcg Inhalation Daily  . vancomycin  500 mg Oral Custom   Continuous Infusions:  PRN Meds:acetaminophen, acetaminophen, albuterol, HYDROcodone-acetaminophen, zolpidem  Assessment/Plan:  DIARRHEA: Sl better COPD EXAC: doing well  PNEUMONIA: off abx  HYPERTENSION: BP OK  HYPOKALEMIA: replace  HYPERLIPIDEMIA: on Rx     LOS: 4 days   [name] [name] ALAN 07/25/2012, 10:32 AM

## 2012-07-25 NOTE — Progress Notes (Signed)
Wandell G Granieri 2:37 PM  Subjective: Patient doing much better and has only been 2 times since last night and no new complaints and his CT was discussed with the patient and his daughter-in-law  Objective: Vital signs stable afebrile abdomen is soft nontender patient in good spirits few stool studies still pending  Assessment: Fluctuating diarrhea  Plan: We discussed proceeding with a flexible sigmoidoscopy tomorrow versus followup as an outpatient to decide whether he needs it or not and he and the family would like to proceed tomorrow just to be sure to rule out pseudomembranous colitis and rebiopsy to rule out microscopic colitis  Cherika Jessie E  

## 2012-07-26 ENCOUNTER — Telehealth: Payer: Self-pay | Admitting: Internal Medicine

## 2012-07-26 ENCOUNTER — Encounter (HOSPITAL_COMMUNITY)
Admission: EM | Disposition: A | Discharge status: Skilled Nursing Facility | Payer: Self-pay | Source: Home / Self Care | Attending: Internal Medicine

## 2012-07-26 ENCOUNTER — Encounter (HOSPITAL_COMMUNITY): Payer: Self-pay | Admitting: *Deleted

## 2012-07-26 HISTORY — PX: FLEXIBLE SIGMOIDOSCOPY: SHX5431

## 2012-07-26 LAB — BASIC METABOLIC PANEL
BUN: 18 mg/dL (ref 6–23)
CO2: 32 mEq/L (ref 19–32)
Chloride: 102 mEq/L (ref 96–112)
Glucose, Bld: 109 mg/dL — ABNORMAL HIGH (ref 70–99)
Potassium: 4 mEq/L (ref 3.5–5.1)

## 2012-07-26 SURGERY — SIGMOIDOSCOPY, FLEXIBLE
Anesthesia: Moderate Sedation

## 2012-07-26 MED ORDER — CHOLESTYRAMINE LIGHT 4 G PO PACK: 4.0000 g | PACK | Freq: Two times a day (BID) | ORAL | Status: DC

## 2012-07-26 MED ORDER — PANCRELIPASE (LIP-PROT-AMYL) 12000-38000 UNITS PO CPEP: 2.0000 | ORAL_CAPSULE | Freq: Three times a day (TID) | ORAL | Status: DC

## 2012-07-26 MED ORDER — MOMETASONE FURO-FORMOTEROL FUM 100-5 MCG/ACT IN AERO: 2.0000 | INHALATION_SPRAY | Freq: Two times a day (BID) | RESPIRATORY_TRACT | Status: DC

## 2012-07-26 MED ORDER — CLONAZEPAM 0.5 MG PO TABS: 0.5000 mg | ORAL_TABLET | Freq: Two times a day (BID) | ORAL | Status: DC | PRN

## 2012-07-26 MED ORDER — SODIUM CHLORIDE 0.9 % IV SOLN: INTRAVENOUS | Status: DC

## 2012-07-26 MED ORDER — CLONAZEPAM 1 MG PO TABS: 1.0000 mg | ORAL_TABLET | Freq: Every evening | ORAL | Status: DC | PRN

## 2012-07-26 MED ORDER — VANCOMYCIN 50 MG/ML ORAL SOLUTION: 500.0000 mg | Freq: Three times a day (TID) | ORAL | Status: DC

## 2012-07-26 MED ORDER — PREDNISONE 20 MG PO TABS: 40.0000 mg | ORAL_TABLET | Freq: Every day | ORAL | Status: DC

## 2012-07-26 MED ORDER — POTASSIUM CHLORIDE CRYS ER 20 MEQ PO TBCR: 40.0000 meq | EXTENDED_RELEASE_TABLET | Freq: Every day | ORAL | Status: AC

## 2012-07-26 MED ORDER — FENTANYL CITRATE 0.05 MG/ML IJ SOLN
INTRAMUSCULAR | Status: AC
  Filled 2012-07-26: qty 2

## 2012-07-26 MED ORDER — FUROSEMIDE 20 MG PO TABS: 20.0000 mg | ORAL_TABLET | Freq: Every day | ORAL | Status: DC

## 2012-07-26 MED ORDER — TIOTROPIUM BROMIDE MONOHYDRATE 18 MCG IN CAPS: 18.0000 ug | ORAL_CAPSULE | Freq: Every day | RESPIRATORY_TRACT | Status: AC

## 2012-07-26 MED ORDER — ALBUTEROL SULFATE HFA 108 (90 BASE) MCG/ACT IN AERS: 2.0000 | INHALATION_SPRAY | RESPIRATORY_TRACT | Status: DC | PRN

## 2012-07-26 MED ORDER — HYDROCHLOROTHIAZIDE 25 MG PO TABS
25.0000 mg | ORAL_TABLET | Freq: Every day | ORAL | Status: DC
  Administered 2012-07-26: 25 mg via ORAL
  Filled 2012-07-26: qty 1

## 2012-07-26 MED ORDER — ATENOLOL 50 MG PO TABS: 50.0000 mg | ORAL_TABLET | Freq: Every day | ORAL | Status: DC

## 2012-07-26 MED ORDER — FELODIPINE ER 10 MG PO TB24
10.0000 mg | ORAL_TABLET | Freq: Every day | ORAL | Status: DC
  Administered 2012-07-26: 10 mg via ORAL
  Filled 2012-07-26: qty 1

## 2012-07-26 MED ORDER — MIDAZOLAM HCL 10 MG/2ML IJ SOLN
INTRAMUSCULAR | Status: AC
  Filled 2012-07-26: qty 2

## 2012-07-26 MED ORDER — ZOLPIDEM TARTRATE 10 MG PO TABS: 10.0000 mg | ORAL_TABLET | Freq: Every evening | ORAL | Status: DC | PRN

## 2012-07-26 MED ORDER — SACCHAROMYCES BOULARDII 250 MG PO CAPS: 500.0000 mg | ORAL_CAPSULE | Freq: Two times a day (BID) | ORAL | Status: DC

## 2012-07-26 NOTE — Progress Notes (Signed)
CSW met with pt at bedside to discuss SNF bed offers.   Pt expressed frustrations about having to go to short term rehab and feels that the would be able to manage at home. CSW discussed reasoning for short term rehab recommendation and discussed with pt that based on PT treatment note it does not appear that pt will need long at rehab facility.  Pt confirmed that he is agreeable to going to rehab, but wants to wait until his son arrives to make decision about placement.   Pt aware that plan is for discharge today.   CSW to await pt son to arrive to hospital in order to get pt decision about SNF facility.  Jacklynn Lewis, MSW, LCSWA  Clinical Social Work [phone]

## 2012-07-26 NOTE — Progress Notes (Signed)
Pt is to be discharged to [facility] today. Pt is in NAD, IV is out, all paperwork has been reviewed/discussed with patient, and there are no questions/concerns at this time. Assessment is unchanged from this morning. Pt is to be accompanied downstairs by staff and family via wheelchair. Pt's son will be transporting patient to facility this afternoon.

## 2012-07-26 NOTE — Progress Notes (Signed)
Physical Therapy Treatment Patient Details Name: [name] [name] MRN: [id number] DOB: May 18, 1920 Today's Date: 07/26/2012 Time: 0845-0900 PT Time Calculation (min): 15 min  PT Assessment / Plan / Recommendation Comments on Treatment Session  Pt reports he's tired of being sick, still having some diarrhea and new tightness in chest. Stated he couldn't walk as far last week as he can now. SaO2 93% on 3L O2 at rest, 90% on 3L O2 with walking 250'. Pt steady while walking.     Follow Up Recommendations  SNF     Does the patient have the potential to tolerate intense rehabilitation     Barriers to Discharge        Equipment Recommendations  None recommended by PT    Recommendations for Other Services    Frequency Min 3X/week   Plan Discharge plan remains appropriate;Frequency remains appropriate    Precautions / Restrictions Precautions Precautions: Fall Precaution Comments: on O2 Restrictions Weight Bearing Restrictions: No   Pertinent Vitals/Pain *Pt denies pain SaO2 93% on 3L O2 at rest, 90% on 3L O2 with walking**    Mobility  Bed Mobility Bed Mobility: Supine to Sit;Sit to Supine Supine to Sit: 6: Modified independent (Device/Increase time);HOB elevated Sit to Supine: HOB elevated;6: Modified independent (Device/Increase time) Transfers Transfers: Sit to Stand;Stand to Sit Sit to Stand: From bed;Without upper extremity assist;5: Supervision Stand to Sit: 5: Supervision;To bed;Without upper extremity assist Ambulation/Gait Ambulation/Gait Assistance: 5: Supervision Ambulation Distance (Feet): 250 Feet Assistive device: None Ambulation/Gait Assistance Details: on 3L O2 walking Gait Pattern: Within Functional Limits Gait velocity: fast    Exercises     PT Diagnosis:    PT Problem List:   PT Treatment Interventions:     PT Goals Acute Rehab PT Goals PT Goal Formulation: With patient/family Time For Goal Achievement: 08/06/12 Potential to Achieve Goals:  Good Pt will go Supine/Side to Sit: Independently PT Goal: Supine/Side to Sit - Progress: Progressing toward goal Pt will go Sit to Supine/Side: Independently PT Goal: Sit to Supine/Side - Progress: Progressing toward goal Pt will go Sit to Stand: with modified independence PT Goal: Sit to Stand - Progress: Progressing toward goal Pt will go Stand to Sit: with modified independence PT Goal: Stand to Sit - Progress: Progressing toward goal Pt will Ambulate: >150 feet;with supervision;with least restrictive assistive device PT Goal: Ambulate - Progress: Met  Visit Information  Last PT Received On: 07/26/12 Assistance Needed: +1    Subjective Data  Subjective: Last week I couldn't walk this far bc I got winded.  Patient Stated Goal: to get over the diarrhea   Cognition  Cognition Overall Cognitive Status: Appears within functional limits for tasks assessed/performed Arousal/Alertness: Awake/alert Orientation Level: Appears intact for tasks assessed Behavior During Session: [facility] for tasks performed    Balance     End of Session PT - End of Session Equipment Utilized During Treatment: Gait belt;Oxygen Activity Tolerance: Patient tolerated treatment well Patient left: in bed;with call bell/phone within reach;with family/visitor present Nurse Communication: Mobility status   GP     Ralene Bathe Kistler 07/26/2012, 9:10 AM [phone]

## 2012-07-26 NOTE — Telephone Encounter (Signed)
LMTCB

## 2012-07-26 NOTE — Telephone Encounter (Signed)
Offer DME script nebulizer, script albuterol 0.086% neb solution, # 50, 1 neb every 6 hours as needed, refill prn  For Dx COPD

## 2012-07-26 NOTE — Discharge Summary (Signed)
DISCHARGE SUMMARY  [name] [name]  MR#: [id number]  DOB:06/26/1920  Date of Admission: 07/21/2012 Date of Discharge: 07/26/2012  Attending Physician:Shaunn Tackitt A  Patient's NWG:[id number],QMVHQIO A, MD  Consults:  [facility]  Discharge Diagnoses: Active Problems:   OBSTRUCTIVE SLEEP APNEA   COPD-  Oxygen dependent   Hypoxemia   DOE (dyspnea on exertion)   COPD exacerbation   Bilateral pulmonary infiltrates on CXR   Diarrhea- chronic, since 2009   Discharge Medications:   Medication List    STOP taking these medications       clonazePAM 0.5 MG tablet  Commonly known as:  KLONOPIN     felodipine 10 MG 24 hr tablet  Commonly known as:  PLENDIL     hydrochlorothiazide 25 MG tablet  Commonly known as:  HYDRODIURIL     paregoric 2 MG/5ML solution      TAKE these medications       albuterol 108 (90 BASE) MCG/ACT inhaler  Commonly known as:  PROVENTIL HFA;VENTOLIN HFA  Inhale 2 puffs into the lungs every 3 (three) hours as needed for wheezing or shortness of breath.     amLODipine 5 MG tablet  Commonly known as:  NORVASC  Take 10 mg by mouth daily.     aspirin 81 MG tablet  Take 81 mg by mouth daily.     atenolol 50 MG tablet  Commonly known as:  TENORMIN  Take 50 mg by mouth daily.     cholestyramine light 4 G packet  Commonly known as:  PREVALITE  Take 1 packet (4 g total) by mouth every 12 (twelve) hours.     diphenoxylate-atropine 2.5-0.025 MG per tablet  Commonly known as:  LOMOTIL  Take 1 tablet by mouth 4 (four) times daily as needed for diarrhea or loose stools.     furosemide 20 MG tablet  Commonly known as:  LASIX  Take 1 tablet (20 mg total) by mouth daily.     HYDROcodone-acetaminophen 5-325 MG per tablet  Commonly known as:  NORCO/VICODIN  Take 1 tablet by mouth every 6 (six) hours as needed for pain.     lipase/protease/amylase 96295 UNITS Cpep  Commonly known as:  CREON-10/PANCREASE  Take 2 capsules by mouth 3 (three) times daily  before meals.     mometasone-formoterol 100-5 MCG/ACT Aero  Commonly known as:  DULERA  Inhale 2 puffs into the lungs 2 (two) times daily. Rinse mouth     omeprazole 20 MG capsule  Commonly known as:  PRILOSEC  Take 20 mg by mouth daily.     potassium chloride SA 20 MEQ tablet  Commonly known as:  K-DUR,KLOR-CON  Take 2 tablets (40 mEq total) by mouth daily.     predniSONE 20 MG tablet  Commonly known as:  DELTASONE  Take 2 tablets (40 mg total) by mouth daily with breakfast.     saccharomyces boulardii 250 MG capsule  Commonly known as:  FLORASTOR  Take 2 capsules (500 mg total) by mouth 2 (two) times daily.     simvastatin 80 MG tablet  Commonly known as:  ZOCOR  Take 80 mg by mouth at bedtime.     tiotropium 18 MCG inhalation capsule  Commonly known as:  SPIRIVA  Place 1 capsule (18 mcg total) into inhaler and inhale daily.     vancomycin 50 mg/mL oral solution  Commonly known as:  VANCOCIN  Take 10 mLs (500 mg total) by mouth every 8 (eight) hours.     zolpidem 5  MG tablet  Commonly known as:  AMBIEN  Take 5 mg by mouth at bedtime as needed for sleep.        Hospital Procedures: Ct Abdomen Pelvis Wo Contrast  07/24/2012  *RADIOLOGY REPORT*  Clinical Data: 79 year old male with lower abdominal pain. Persistent diarrhea.  Renal insufficiency precludes IV contrast.  CT ABDOMEN AND PELVIS WITHOUT CONTRAST  Technique:  Multidetector CT imaging of the abdomen and pelvis was performed following the standard protocol without intravenous contrast.  Comparison: CTs abdomen pelvis 09/11/2003 and earlier.  Findings: No pericardial or left pleural effusion.  Small layering right pleural effusion.  Scarring, bronchiectasis, and compressive atelectasis at the lung bases.  Postoperative changes to the lumbar spine.  Advanced the adjacent segment disease in the lower thoracic spine.  Osteopenia.  Sequelae of median sternotomy. No acute osseous abnormality identified.  Penile implant.   Surgical clips at the prostate bed such as from prostatectomy.  No pelvic free fluid.  Negative rectum.  Sigmoid diverticulosis with some indistinctness of the sigmoid wall.  No definite mesenteric stranding.  Oral contrast has reached the mid descending colon which has a more normal appearance. Transverse colon, hepatic flexure, right colon, appendix, and distal small bowel are within normal limits.  No dilated small bowel loops.  Some small bowel loops demonstrate wall thickening. No small bowel mesenteric stranding identified.  Unremarkable noncontrast stomach and duodenum.  Gallbladder surgically absent.  Occasional tiny hypodense areas in the liver are too small to characterize but likely benign.  Spleen, pancreas, and adrenal glands are within normal limits.  Exophytic left renal upper pole mass measuring 3 cm has densitometry of a simple cyst.  Small bilateral nephrolithiasis or renal sinus calcified atherosclerosis.  No hydronephrosis.  Bilateral perinephric stranding probably is age related.  No hydroureter. Unremarkable bladder.  Severe calcified atherosclerosis of the aorta and its branches. Infrarenal enlargement of the aorta up to 34 mm diameter.  The proximal to the aortoiliac bifurcation there is an ulcerated plaque or small chronic dissection (coronal image 51).  Atherosclerosis and ectasia continues in the iliac arteries.  IMPRESSION: 1.  Sigmoid diverticulosis where mild diverticulitis cannot be excluded.  Remaining colon appears within normal limits. 2.  No bowel obstruction, but intermittent small bowel wall thickening might also reflect inflammatory enteritis.  Ischemic enteritis not favored at this time due to absent mesenteric stranding / fluid. 3.  Extensive atherosclerosis throughout the abdomen and pelvis. Infrarenal abdominal aorta up to 34 mm diameter. Recommend followup by [place] in 3 years.  This recommendation follows ACR consensus guidelines: White Paper of the ACR Incidental Findings  Committee II on Vascular Findings.  J Am Coll Radiol 2013; 10:789-794. 4.  Small layering right pleural effusion.   Original Report Authenticated By: Erskine Speed, M.D.    Dg Chest 2 View  07/23/2012  *RADIOLOGY REPORT*  Clinical Data: History of pulmonary infiltrates.  Follow-up. Shortness of breath.  CHEST - 2 VIEW  Comparison: 07/21/2012.  Findings: There is mild cardiac silhouette enlargement. Ectasia and nonaneurysmal calcification of the thoracic aorta are seen. The patient has undergone previous median sternotomy and coronary artery bypass grafting. Valvular replacement procedure has also been performed.  There is generalized hyperinflation configuration with flattening of diaphragm on lateral image consistent with COPD.  There is slight chronic blunting of the left costophrenic angle with minimal fibrotic change in the left base.  No acute left lung infiltrates are seen.  Patchy infiltrative densities are seen in the right perihilar region, right  midlung area and in the right base on the PA image. There is slight interval increase in the coalescent infiltrate density in the right perihilar region extending into the right upper lobe. There is some posterior basilar infiltrative density and atelectasis seen on lateral image.  This inferior density is slightly less prominent on the previous study.  There are osteophytes in the spine.  Hardware from prior posterior fusion surgery is seen on the inferior edge of the lateral image.  IMPRESSION: Mild cardiac silhouette enlargement.  Post CABG.  Hyperinflation consistent with COPD.Patchy infiltrative densities are seen in the right perihilar region, right midlung area and in the right base on the PA image.  There is slight interval increase in the coalescent infiltrate density in the right perihilar region extending into the right upper lobe.  There is some posterior basilar infiltrative density and atelectasis seen on lateral image.  This inferior density is  slightly less prominent on the previous study.   Original Report Authenticated By: Onalee Hua Call    Dg Chest 2 View  07/21/2012  *RADIOLOGY REPORT*  Clinical Data: Short of breath  CHEST - 2 VIEW  Comparison: 12/08/2011  Findings: Patchy and ill-defined opacities throughout the right lung have developed.  Scarring at the left base.  Linear atelectasis at the right base.  No pneumothorax.  Upper normal heart size.  Postoperative changes.  Focal airspace disease at the posterior right lung base.  No pneumothorax.  Osteopenia.  IMPRESSION: Patchy airspace and ill-defined opacities within the right lung as described.  This is nonspecific and may reflect an inflammatory process.  Other causes of airspace disease should be considered.   Original Report Authenticated By: Jolaine Click, M.D.     History of Present Illness: 79 yo male with oxygen dependent copd, osa, ascad and avr presenting unannounced to er with increased sob. As baseline on home oxygen and intermittently compliant. Using home nebs bid at most with poor outpatient follow-up. Chronic diarrhea followed by [facility]--no blood, no pain, no weight loss. Finally, recent difficulty with burning and stinging toes, recently tried neurontin, lyrica and ambien. Has been found to have b12 deficiency--not started replacement as yet. To Er by ems with sudden increase in SOB this am--better now. No cp, blood, fever or sputum. Some LE swelling. Admitted for nebs and steroids.      Hospital Course: Patient was admitted to the medical service largely because of a COPD exacerbation secondarily to help manage his diarrhea. From a pulmonary standpoint, a bar pulmonary was consulted and had no further suggestions. As a baseline he is oxygen dependent. Despite the chest radiograph it was not felt that there was any significant underlying infectious process and low-dose Lasix was started at the outside there was a bit of fluid overload potassium was repleted and ultimate  whey steroid wean down to oral prednisone with a tapering dose continuing. He readily relates that his respiratory status is as good as is been in months. Some of the difficulty is that has not had regular followup and compliance with inhalers is questionable as well he does wear supplemental oxygen as a baseline 2 L nasal cannula. From a bowel standpoint, he's had chronic diarrhea dating back to 2009 with a fairly extensive workup by Dr. Christiana Pellant of [facility]. At that point colonoscopy was done and he is been a fairly complex trial of regimens ever since. Interestingly enough, despite any see me at least twice in the last 2 weeks he did not mention  anything regarding his bowels most of his issue has been burning and stinging in his feet which has begun to be worked up as an outpatient he has been placed empirically on pancreatic enzymes, Questran and oral vancomycin with some overall improvement in I did encourage him to the extent that all this takes time we may just have to cope with some baseline paroxysmal diarrhea. He is having a flexible sigmoidoscopy today prior to discharge. He has remained stable from a cardiac standpoint in particular his coronary disease and his aortic valvular disease. He does remain deconditioned and is going to skilled nursing for one to 2 weeks of therapy prior to   Day of Discharge Exam BP 141/66  Pulse 61  Temp(Src) 98.7 F (37.1 C) (Oral)  Resp 18  Ht 6' (1.829 m)  Wt 80.1 kg (176 lb 9.4 oz)  BMI 23.94 kg/m2  SpO2 94%  Physical Exam: General appearance: alert and cooperative Eyes: no scleral icterus Throat: oropharynx moist without erythema Resp: clear to auscultation bilaterally, no distress wearing oxygen prolonged expiratory phase Cardio: regular rate and rhythm, S1, S2 normal and systolic murmur: early systolic 2/6, crescendo at lower left sternal border Extremities: no clubbing, cyanosis or edema  Discharge Labs:  Recent Labs  07/25/12 0430  07/26/12 0345  NA 142 138  K 4.1 4.0  CL 106 102  CO2 30 32  GLUCOSE 100* 109*  BUN 19 18  CREATININE 0.69 0.83  CALCIUM 8.2* 8.3*   No results found for this basename: AST, ALT, ALKPHOS, BILITOT, PROT, ALBUMIN,  in the last 72 hours No results found for this basename: WBC, NEUTROABS, HGB, HCT, MCV, PLT,  in the last 72 hours No results found for this basename: CKTOTAL, CKMB, CKMBINDEX, TROPONINI,  in the last 72 hours No results found for this basename: TSH, T4TOTAL, FREET3, T3FREE, THYROIDAB,  in the last 72 hours No results found for this basename: VITAMINB12, FOLATE, FERRITIN, TIBC, IRON, RETICCTPCT,  in the last 72 hours  Discharge instructions:     Discharge Orders   Future Appointments Provider Department Dept Phone   08/10/2012 3:30 PM Waymon Budge, MD [facility] [phone]   12/07/2012 9:00 AM Waymon Budge, MD  [facility] [phone]   Future Orders Complete By Expires     Diet - low sodium heart healthy  As directed     Increase activity slowly  As directed        Disposition: skilled nursing   Follow-up Appts: Follow-up with Dr. Jacky Kindle at [facility] in  2 weeks after discharge from skilled nursing .  Call for appointment.  Conditionimproved  Tests Needing Follow-up: None, ultimately some of his bowel regimen can probably be weaned off depending upon the flexible sigmoidoscopy later today. If this is clear of probably discontinue the vancomycin I would continue empiric Questran, probiotic and when necessary Lomotil  Signed: Felicitas Sine A 07/26/2012, 8:03 AM

## 2012-07-26 NOTE — H&P (View-Only) (Signed)
[name] [name] 2:37 PM  Subjective: Patient doing much better and has only been 2 times since last night and no new complaints and his CT was discussed with the patient and his daughter-in-law  Objective: Vital signs stable afebrile abdomen is soft nontender patient in good spirits few stool studies still pending  Assessment: Fluctuating diarrhea  Plan: We discussed proceeding with a flexible sigmoidoscopy tomorrow versus followup as an outpatient to decide whether he needs it or not and he and the family would like to proceed tomorrow just to be sure to rule out pseudomembranous colitis and rebiopsy to rule out microscopic colitis  [name] [name] E

## 2012-07-26 NOTE — Op Note (Signed)
[facility] [address] [place], [address]   FLEXIBLE SIGMOIDOSCOPY PROCEDURE REPORT  PATIENT: [name] [name], [name] [name]  MR#: [id number] BIRTHDATE: Feb 25, 1921 , 89  yrs. old GENDER: Male ENDOSCOPIST: Carman Ching, MD REFERRED BY:  Dr. Jacky Kindle PROCEDURE DATE:  07/26/2012 PROCEDURE:     Flexible Sigmoidoscopy Biopsy ASA CLASS:   class III INDICATIONS: chronic diarrhea MEDICATIONS:  procedure done without sedation and unprepped  DESCRIPTION OF PROCEDURE:   the procedures been explained to the patient including the fact that this was to be done sedated. Digital exam was performed was normal. The scope was inserted up to about 40 to 45 cm. There was a significant diverticular disease. The Pentax adult colonoscope was used. There was no signs of active diverticulitis, no pseudomembraines. The mucosa appeared to be completely endoscopic with normal. Random biopsies were obtained to rule out microscopic colitis.        COMPLICATIONS:  None  ENDOSCOPIC IMPRESSION: 1. Chronic diarrhea. No obvious causes on sigmoidoscopy. Mucosa is grossly normal.  RECOMMENDATIONS: 1. Microscopic evaluation for chronic colitis. 2. Agree with discharge on cholestyramine since this does seem to be helping. 3. Follow-up with Dr. Madilyn Fireman within the next few weeks.   _______________________________ Rosalie DoctorCarman Ching, MD 07/26/2012 11:28 AM  CC: Dr. Jacky Kindle

## 2012-07-26 NOTE — Interval H&P Note (Signed)
History and Physical Interval Note:  07/26/2012 11:01 AM  [name] [name]  has presented today for surgery, with the diagnosis of diarrhea  The various methods of treatment have been discussed with the patient and family. After consideration of risks, benefits and other options for treatment, the patient has consented to  Procedure(s): FLEXIBLE SIGMOIDOSCOPY (N/A) as a surgical intervention .  The patient's history has been reviewed, patient examined, no change in status, stable for surgery.  I have reviewed the patient's chart and labs.  Questions were answered to the patient's satisfaction.     Albina Gosney JR,Brayn Eckstein L

## 2012-07-26 NOTE — Progress Notes (Signed)
Pt son arrived to hospital and notified CSW that he was present in pt room.  CSW met with pt and pt son in pt room. Pt and pt son discussed that they reviewed bed offers and are interested in [facility].   CSW contacted facility who confirmed they were able to offer pt a bed.  CSW to facilitate pt discharge needs this afternoon to [facility].   Jacklynn Lewis, MSW, LCSWA  Clinical Social Work [phone]

## 2012-07-26 NOTE — Progress Notes (Signed)
Pt for discharge to [facility].  CSW faxed pt discharge information via [facility], discussed with pt and pt son at bedside, provided discharge packet to pt son, provided RN phone number to call report, and pt son transporting pt via private vehicle to [facility].  No further social work needs identified at this time.   CSW signing off.   Jacklynn Lewis, MSW, LCSWA  Clinical Social Work [phone]

## 2012-07-27 ENCOUNTER — Encounter (HOSPITAL_COMMUNITY): Payer: Self-pay | Admitting: Gastroenterology

## 2012-07-29 NOTE — Telephone Encounter (Signed)
Script pending for albuterol 0.083% neb solution, # 50, 1 neb every 6 hours as needed, prn refills For Dx COPD LMOM x 1 Need to know where he wants [place] to send this Rx---[facility] or DME....what is his DME?

## 2012-08-06 NOTE — Telephone Encounter (Signed)
LMTCB at home number; cell number not on at this time.

## 2012-08-06 NOTE — Telephone Encounter (Signed)
ATC pt's home # - [email or web address] ATC pt's cell # - went direct to msg stating "I'm sorry, the person you are calling has a VM box that hasn't been set up." As msg was created by Florentina Addison and is a CY pt, will route msg to her box to follow up on.

## 2012-08-09 NOTE — Telephone Encounter (Signed)
LMTCB

## 2012-08-09 NOTE — Telephone Encounter (Signed)
Returning call can be reached at [phone].[name] [name]

## 2012-08-10 ENCOUNTER — Ambulatory Visit: Payer: Medicare Other | Admitting: Internal Medicine

## 2012-08-13 NOTE — Telephone Encounter (Signed)
Pt came by mistakenly thinking he had an appointment today; patient was asked about this matter-he stated that [facility] and [facility] together is working and does not want the nebulizer medication. Will sign off on this message as nothing else needed at this time. Pt was made aware of appointment time and date with CY.

## 2012-08-25 ENCOUNTER — Ambulatory Visit (INDEPENDENT_AMBULATORY_CARE_PROVIDER_SITE_OTHER)
Admission: RE | Admit: 2012-08-25 | Discharge: 2012-08-25 | Disposition: A | Discharge status: Home / Self Care | Payer: Medicare Other | Source: Ambulatory Visit | Attending: Internal Medicine | Admitting: Internal Medicine

## 2012-08-25 ENCOUNTER — Ambulatory Visit (INDEPENDENT_AMBULATORY_CARE_PROVIDER_SITE_OTHER): Payer: Medicare Other | Admitting: Internal Medicine

## 2012-08-25 ENCOUNTER — Encounter: Payer: Self-pay | Admitting: Internal Medicine

## 2012-08-25 ENCOUNTER — Telehealth: Payer: Self-pay | Admitting: Internal Medicine

## 2012-08-25 ENCOUNTER — Other Ambulatory Visit (INDEPENDENT_AMBULATORY_CARE_PROVIDER_SITE_OTHER): Payer: Medicare Other

## 2012-08-25 VITALS — BP 122/76 | HR 72 | Ht 69.0 in | Wt 171.9 lb

## 2012-08-25 DIAGNOSIS — J441 Chronic obstructive pulmonary disease with (acute) exacerbation: Secondary | ICD-10-CM

## 2012-08-25 DIAGNOSIS — R0600 Dyspnea, unspecified: Secondary | ICD-10-CM

## 2012-08-25 DIAGNOSIS — R0989 Other specified symptoms and signs involving the circulatory and respiratory systems: Secondary | ICD-10-CM

## 2012-08-25 DIAGNOSIS — R918 Other nonspecific abnormal finding of lung field: Secondary | ICD-10-CM

## 2012-08-25 DIAGNOSIS — I509 Heart failure, unspecified: Secondary | ICD-10-CM

## 2012-08-25 DIAGNOSIS — R0609 Other forms of dyspnea: Secondary | ICD-10-CM

## 2012-08-25 LAB — BRAIN NATRIURETIC PEPTIDE: Pro B Natriuretic peptide (BNP): 313 pg/mL — ABNORMAL HIGH (ref 0.0–100.0)

## 2012-08-25 LAB — BASIC METABOLIC PANEL
CO2: 28 mEq/L (ref 19–32)
Calcium: 9 mg/dL (ref 8.4–10.5)
Chloride: 105 mEq/L (ref 96–112)
Potassium: 5.1 mEq/L (ref 3.5–5.1)
Sodium: 140 mEq/L (ref 135–145)

## 2012-08-25 LAB — CBC WITH DIFFERENTIAL/PLATELET
Basophils Relative: 0.1 % (ref 0.0–3.0)
Eosinophils Relative: 0.2 % (ref 0.0–5.0)
Hemoglobin: 12.3 g/dL — ABNORMAL LOW (ref 13.0–17.0)
Lymphocytes Relative: 4.8 % — ABNORMAL LOW (ref 12.0–46.0)
Monocytes Relative: 5.4 % (ref 3.0–12.0)
Neutro Abs: 9.3 10*3/uL — ABNORMAL HIGH (ref 1.4–7.7)
Neutrophils Relative %: 89.5 % — ABNORMAL HIGH (ref 43.0–77.0)
RBC: 4.31 Mil/uL (ref 4.22–5.81)
WBC: 10.4 10*3/uL (ref 4.5–10.5)

## 2012-08-25 NOTE — Telephone Encounter (Signed)
Caller is a Network engineer and friend of patients family(also an RN that checks on patient often). Wanted to let us know what DME patient uses as patient showed her the AVS from today's visit. Updated chart with information and will send to [facility] as FYI as well.

## 2012-08-25 NOTE — Patient Instructions (Addendum)
Order- CBC, BMET, BNP, D-dimer  Order- CXR  Dx COPD, CHF  Increase O2 to 3L at rest, 4L with exertion.  Our [facility] will try to find your DME company in [place] and send them the order

## 2012-08-25 NOTE — Assessment & Plan Note (Signed)
Chronic hypoxic respiratory failure. Apparent exacerbation may reflect pulmonary edema. No changes pending labs.

## 2012-08-25 NOTE — Assessment & Plan Note (Signed)
BNP was 1800 on 07/23/12. I  favor these infiltrates being pulmonary edema on COPD, though he might have had a pneumonitis as well. Edema would explain increased dyspnea. Plan- Set O2 3-4L, Labs for BMET, BNP, CXR. Results to share with son and Dr Jacky Kindle.           At age 79, consider if Hospice candidate.

## 2012-08-25 NOTE — Progress Notes (Signed)
Patient ID: [name] [name], male    DOB: [birth year], 79 y.o.   MRN: [id number]  HPI 12/05/10- 51 yoM former smoker followed for COPD, OSA complicated by HBP Last here June 06, 2010 He thinks his exercise capacity is better on room air than on 2 L oxygen.  He doesn't use CPAP at all, but sleeps with O2. He is leaving the [place], to follow with his local physicians because logistics are too hard to get scripts filled through [place].  He discussed dysphagia with Dr Annalee Genta- told to increase omeprazole from 20 >40mg , but he denies any acid/ heart burn. He wants it left alone.   06/09/11- 89 yoM former smoker followed for COPD, OSA complicated by HBP Has not used CPAP in years. Stays on 2 L of oxygen. Gradually more short of breath with no acute change or event. Denies pain, blood, purulent discharge or palpitation.  12/08/11- 71 yoM former smoker followed for COPD, OSA complicated by HBP   Patient states same as last visit. c/o sob, wheezing, and constant runny nose.  Denies chest pain, chest tightness,and cough.  Findings oxygen prongs bothersome at night. We discussed hypoxia and options. He would like to try a metered rescue inhaler discussed this as well. He has some dry cough which is unchanged with no chest pain, no sputum, no blood or palpitation. Very limited exercise tolerance but he paces himself  08/25/12- 89 yoM former smoker followed for COPD, OSA complicated by HBP FOLLOWS FOR:has noticed past several months that his breathing has gotten worse-low O2 levels and SOB. Pt entered room at 75% on 2.5L/M cont (his portable tank); placed on 3L/M cont our tank. Rechecked sat and got 92%. He doesn't know his meds or his DME company. Note from son reports increased SOB and asks thoughts. Patient indicates more awareness DOB/ DOE x 3-4 weeks, feet swelling more and soles of feet tender. Lasix dose reportedly doubled last week. Prednisone course helped.  Hosp 4/9-14/14 w/ DC summary listing OSA,  COPD, diarrhea. Patient describes mainly an acute diarrheal illness. CXR from that visit looks more like CHF/edema w/ BNP> 1000.  CXR 07/23/12 IMPRESSION:  Mild cardiac silhouette enlargement. Post CABG. Hyperinflation  consistent with COPD.Patchy infiltrative densities are seen in the  right perihilar region, right midlung area and in the right base on  the PA image. There is slight interval increase in the coalescent  infiltrate density in the right perihilar region extending into the  right upper lobe.  There is some posterior basilar infiltrative density and  atelectasis seen on lateral image. This inferior density is  slightly less prominent on the previous study.  Original Report Authenticated By: Onalee Hua Call  Review of Systems- see HPI Constitutional:   No-   weight loss, night sweats, fevers, chills, fatigue, lassitude. HEENT:   No-  headaches, difficulty swallowing, tooth/dental problems, sore throat,       No-  sneezing, itching, ear ache, nasal congestion, post nasal drip,  CV:  No-   chest pain, orthopnea, PND, +swelling in lower extremities, No-anasarca, dizziness, palpitations Resp: +  shortness of breath with exertion or at rest.              + scant productive cough,  No non-productive cough,  No-  coughing up of blood.              No-   change in color of mucus.  No- wheezing.   Skin: Legs itch GI:  No-  heartburn, indigestion, abdominal pain, nausea, vomiting, GU:  MS:  No-   joint pain or swelling.   Neuro- no change Psych:  No- change in mood or affect. No depression or anxiety. + memory loss.  Objective:   Physical Exam General- Alert, Oriented, Affect-appropriate, Distress- none acute. Looks older, weaker.      Helios Portable 2.5L sat 75% walking hall, changed to our 3L tank at rest-> 92% Skin- excoriated eczema on ankles Lymphadenopathy- none Head- atraumatic            Eyes- Gross vision intact, PERRLA, conjunctivae clear secretions            Ears-  Hearing, canals normal            Nose- Clear, No- Septal dev, mucus, polyps, erosion, perforation             Throat- Mallampati II , mucosa clear , drainage- none, tonsils- atrophic Neck- flexible , trachea midline, no stridor , thyroid nl, carotid no bruit Chest - symmetrical excursion , unlabored           Heart/CV- RRR , 1/6 AS murmur , no gallop  , no rub, nl s1 s2                           - JVD- none , edema-2-3+, stasis changes-+, varices- none           Lung- clear/ distant, cough- none , dullness-none, rub- none, +puffing, pursed lips           Chest wall-  Abd-  Br/ Gen/ Rectal- Not done, not indicated Extrem- cyanosis- none, clubbing, none, atrophy- none, strength- nl Neuro- grossly intact to observation

## 2012-08-27 ENCOUNTER — Other Ambulatory Visit: Payer: Medicare Other

## 2012-08-27 ENCOUNTER — Other Ambulatory Visit: Payer: Self-pay | Admitting: Internal Medicine

## 2012-08-27 DIAGNOSIS — R0602 Shortness of breath: Secondary | ICD-10-CM

## 2012-08-27 DIAGNOSIS — R609 Edema, unspecified: Secondary | ICD-10-CM

## 2012-08-27 NOTE — Progress Notes (Signed)
Quick Note:  Pt's son aware of results. ______

## 2012-08-27 NOTE — Progress Notes (Signed)
Quick Note:  Pt's son is aware of results and test procedures ordered; PCC's will call Jillyn Hidden with appointment dates/times, and location. ______

## 2012-08-30 ENCOUNTER — Ambulatory Visit (INDEPENDENT_AMBULATORY_CARE_PROVIDER_SITE_OTHER)
Admission: RE | Admit: 2012-08-30 | Discharge: 2012-08-30 | Disposition: A | Discharge status: Home / Self Care | Payer: Medicare Other | Source: Ambulatory Visit | Attending: Internal Medicine | Admitting: Internal Medicine

## 2012-08-30 ENCOUNTER — Encounter (INDEPENDENT_AMBULATORY_CARE_PROVIDER_SITE_OTHER): Payer: Medicare Other

## 2012-08-30 DIAGNOSIS — R0602 Shortness of breath: Secondary | ICD-10-CM

## 2012-08-30 DIAGNOSIS — R609 Edema, unspecified: Secondary | ICD-10-CM

## 2012-08-30 MED ORDER — IOHEXOL 350 MG/ML SOLN
80.0000 mL | Freq: Once | INTRAVENOUS | Status: AC | PRN
  Administered 2012-08-30: 80 mL via INTRAVENOUS

## 2012-08-31 ENCOUNTER — Telehealth: Payer: Self-pay | Admitting: Internal Medicine

## 2012-08-31 MED ORDER — ZOLPIDEM TARTRATE 5 MG PO TABS: 5.0000 mg | ORAL_TABLET | Freq: Every evening | ORAL | Status: DC | PRN

## 2012-08-31 NOTE — Telephone Encounter (Signed)
Spoke to pt. He is aware of CT Angio results.  While on the phone he was asking if he could have a refill on Ambien. States that CY gave this to him awhile ago but has now run out.  Last OV 08/25/12 Last fill 06/24/10 #30 with 5 additional refills.  No Known Allergies  CY - please advise if it would be okay to refill this for the pt. Thanks.

## 2012-08-31 NOTE — Telephone Encounter (Signed)
Rx has been called in per CY. 

## 2012-08-31 NOTE — Telephone Encounter (Signed)
Per CY-okay to refill Ambien (use 5 mg tablets) as requested.

## 2012-09-02 ENCOUNTER — Telehealth: Payer: Self-pay | Admitting: Internal Medicine

## 2012-09-02 MED ORDER — CLONAZEPAM 0.5 MG PO TABS: 0.5000 mg | ORAL_TABLET | Freq: Two times a day (BID) | ORAL | Status: DC | PRN

## 2012-09-02 MED ORDER — CLONAZEPAM 0.5 MG PO TABS: 0.5000 mg | ORAL_TABLET | Freq: Every morning | ORAL | Status: DC

## 2012-09-02 NOTE — Telephone Encounter (Signed)
Ok to refill 

## 2012-09-02 NOTE — Telephone Encounter (Signed)
Spoke with pharmacy had questions about pts ambiem and klonopin? Wants to know if pt is using both of these for sleep? The rx reads  for klonopin 0.5mg    1 twice a day and in (1-2 at bed time)pharmacy  doesn't want to over medicate the pt No Known Allergies Dr Maple Hudson please advise Thank you

## 2012-09-02 NOTE — Telephone Encounter (Signed)
Per CY-lets have patient continue Ambien as given at bedtime and change Klonopin 0.5mg  to 1 tablet every morning prn.

## 2012-09-02 NOTE — Telephone Encounter (Signed)
[facility] is aware of recs. rx has been fixed. Nothing further was needed

## 2012-09-02 NOTE — Telephone Encounter (Signed)
Received refill request from [facility] for Clonazepam 0.5mg   Last OV 08/25/2012 Pending OV 10/18/2012, 826/2014 Last fill 07/03/2010 #50  No Known Allergies  CY - please advise if it would be okay to refill this. Thanks.

## 2012-10-06 ENCOUNTER — Telehealth: Payer: Self-pay | Admitting: Internal Medicine

## 2012-10-06 MED ORDER — ZOLPIDEM TARTRATE 5 MG PO TABS: 5.0000 mg | ORAL_TABLET | Freq: Every evening | ORAL | Status: DC | PRN

## 2012-10-06 NOTE — Telephone Encounter (Signed)
Spoke with patient-aware that we have not gotten any refill request form [facility] for Ambien; Per CY okay to refill. Pt aware that I called refill to pharmacy. Pharmacy had our fax number wong-they have corrected this for future reference.

## 2012-10-18 ENCOUNTER — Ambulatory Visit: Payer: Medicare Other | Admitting: Internal Medicine

## 2012-11-03 ENCOUNTER — Other Ambulatory Visit: Payer: Self-pay | Admitting: Internal Medicine

## 2012-12-07 ENCOUNTER — Encounter: Payer: Self-pay | Admitting: Internal Medicine

## 2012-12-07 ENCOUNTER — Ambulatory Visit (INDEPENDENT_AMBULATORY_CARE_PROVIDER_SITE_OTHER)
Admission: RE | Admit: 2012-12-07 | Discharge: 2012-12-07 | Disposition: A | Discharge status: Home / Self Care | Payer: Medicare Other | Source: Ambulatory Visit | Attending: Internal Medicine | Admitting: Internal Medicine

## 2012-12-07 ENCOUNTER — Ambulatory Visit (INDEPENDENT_AMBULATORY_CARE_PROVIDER_SITE_OTHER): Payer: Medicare Other | Admitting: Internal Medicine

## 2012-12-07 VITALS — BP 102/58 | HR 66 | Ht 72.0 in | Wt 172.2 lb

## 2012-12-07 DIAGNOSIS — J441 Chronic obstructive pulmonary disease with (acute) exacerbation: Secondary | ICD-10-CM

## 2012-12-07 DIAGNOSIS — J449 Chronic obstructive pulmonary disease, unspecified: Secondary | ICD-10-CM

## 2012-12-07 NOTE — Progress Notes (Signed)
Patient ID: [name] [name], male    DOB: Oct 09, 1920, 79 y.o.   MRN: [id number]  HPI 12/05/10- 25 yoM former smoker followed for COPD, OSA complicated by HBP Last here June 06, 2010 He thinks his exercise capacity is better on room air than on 2 L oxygen.  He doesn't use CPAP at all, but sleeps with O2. He is leaving the [place], to follow with his local physicians because logistics are too hard to get scripts filled through [place].  He discussed dysphagia with Dr Annalee Genta- told to increase omeprazole from 20 >40mg , but he denies any acid/ heart burn. He wants it left alone.   06/09/11- 89 yoM former smoker followed for COPD, OSA complicated by HBP Has not used CPAP in years. Stays on 2 L of oxygen. Gradually more short of breath with no acute change or event. Denies pain, blood, purulent discharge or palpitation.  12/08/11- 66 yoM former smoker followed for COPD, OSA complicated by HBP   Patient states same as last visit. c/o sob, wheezing, and constant runny nose.  Denies chest pain, chest tightness,and cough.  Findings oxygen prongs bothersome at night. We discussed hypoxia and options. He would like to try a metered rescue inhaler discussed this as well. He has some dry cough which is unchanged with no chest pain, no sputum, no blood or palpitation. Very limited exercise tolerance but he paces himself  08/25/12- 89 yoM former smoker followed for COPD, OSA complicated by HBP FOLLOWS FOR:has noticed past several months that his breathing has gotten worse-low O2 levels and SOB. Pt entered room at 75% on 2.5L/M cont (his portable tank); placed on 3L/M cont our tank. Rechecked sat and got 92%. He doesn't know his meds or his DME company. Note from son reports increased SOB and asks thoughts. Patient indicates more awareness DOB/ DOE x 3-4 weeks, feet swelling more and soles of feet tender. Lasix dose reportedly doubled last week. Prednisone course helped.  Hosp 4/9-14/14 w/ DC summary listing OSA,  COPD, diarrhea. Patient describes mainly an acute diarrheal illness. CXR from that visit looks more like CHF/edema w/ BNP> 1000.  CXR 07/23/12 IMPRESSION:  Mild cardiac silhouette enlargement. Post CABG. Hyperinflation  consistent with COPD.Patchy infiltrative densities are seen in the  right perihilar region, right midlung area and in the right base on  the PA image. There is slight interval increase in the coalescent  infiltrate density in the right perihilar region extending into the  right upper lobe.  There is some posterior basilar infiltrative density and  atelectasis seen on lateral image. This inferior density is  slightly less prominent on the previous study.  Original Report Authenticated ByOnalee Hua Call  12/07/12- 89 yoM former smoker followed for COPD, OSA/ failed CPAP complicated by HBP FOLLOWS WUJ:[id number] this is the best he has been in long time.   Son here Has home help 4 hours per day now. Some occasional pains right upper parasternal area, seem to be related to swallowing. No hangup, cough or phlegm. O2 3L/ [place] [facility] in [place]. Prednisone maintenance 20 mg daily/ Dr Jacky Kindle. CT chest 08/27/12 IMPRESSION:  1. Negative for pulmonary embolism.  2. Cardiomegaly with prior aortic valve replacement and prior  CABG.  3. Asymmetric interstitial prominence in the right upper lobe  could reflect asymmetric interstitial edema or infectious  pneumonitis.  4. Nonspecific mild precarinal and right hilar lymphadenopathy.  5. Mild to moderate emphysema.  Original Report Authenticated By: Britta Mccreedy, M.D.   Review  of Systems- see HPI Constitutional:   No-   weight loss, night sweats, fevers, chills, fatigue, lassitude. HEENT:   No-  headaches, difficulty swallowing, tooth/dental problems, sore throat,       No-  sneezing, itching, ear ache, nasal congestion, post nasal drip,  CV:  No-   chest pain, orthopnea, PND, +swelling in lower extremities, No-anasarca, dizziness,  palpitations Resp: +  shortness of breath with exertion or at rest.              + scant productive cough,  No non-productive cough,  No-  coughing up of blood.              No-   change in color of mucus.  No- wheezing.   Skin: Legs itch GI:  No-   heartburn, indigestion, abdominal pain, nausea, vomiting, GU:  MS:  No-   joint pain or swelling.   Neuro- no change Psych:  No- change in mood or affect. No depression or anxiety. + memory loss.  Objective:   Physical Exam General- Alert, Oriented, Affect-appropriate, Distress- none acute. Looks older, weaker.                 Helios Portable 2.5L sat 75% walking hall, changed to our 3L tank at rest-> 92% Skin- excoriated eczema on ankles Lymphadenopathy- none Head- atraumatic            Eyes- Gross vision intact, PERRLA, conjunctivae clear secretions            Ears- Hearing, canals normal            Nose- Clear, No- Septal dev, mucus, polyps, erosion, perforation             Throat- Mallampati II , mucosa clear , drainage- none, tonsils- atrophic Neck- flexible , trachea midline, no stridor , thyroid nl, carotid no bruit Chest - symmetrical excursion , unlabored           Heart/CV- RRR , 1/6 AS murmur , no gallop  , no rub, nl s1 s2                           - JVD- none , edema-1+, stasis changes-+, varices- none           Lung- clear/ distant, cough- none , dullness-none, rub- none, + pursed lips           Chest wall-  Abd-  Br/ Gen/ Rectal- Not done, not indicated Extrem- cyanosis- none, clubbing, none, atrophy- none, strength- nl Neuro- grossly intact to observation

## 2012-12-07 NOTE — Patient Instructions (Addendum)
We can continue O2 at 3 L/ min  Order-CXR dx COPD  Please call as needed

## 2012-12-19 NOTE — Assessment & Plan Note (Signed)
He is doing better. 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 Plan-chest x-ray to followup imaging in May suggesting right upper lobe infiltrate

## 2013-02-09 ENCOUNTER — Emergency Department (HOSPITAL_COMMUNITY): Payer: Medicare Other

## 2013-02-09 ENCOUNTER — Inpatient Hospital Stay (HOSPITAL_COMMUNITY)
Admission: EM | Admit: 2013-02-09 | Discharge: 2013-02-12 | DRG: 482 | Disposition: A | Discharge status: Skilled Nursing Facility | Payer: Medicare Other | Attending: Orthopedic Surgery | Admitting: Orthopedic Surgery

## 2013-02-09 ENCOUNTER — Encounter (HOSPITAL_COMMUNITY)
Admission: EM | Disposition: A | Discharge status: Skilled Nursing Facility | Payer: Self-pay | Source: Home / Self Care | Attending: Orthopedic Surgery

## 2013-02-09 ENCOUNTER — Emergency Department (HOSPITAL_COMMUNITY): Payer: Medicare Other | Admitting: *Deleted

## 2013-02-09 ENCOUNTER — Encounter (HOSPITAL_COMMUNITY): Payer: Medicare Other | Admitting: *Deleted

## 2013-02-09 ENCOUNTER — Encounter (HOSPITAL_COMMUNITY): Payer: Self-pay | Admitting: Emergency Medicine

## 2013-02-09 DIAGNOSIS — S72409A Unspecified fracture of lower end of unspecified femur, initial encounter for closed fracture: Principal | ICD-10-CM | POA: Diagnosis present

## 2013-02-09 DIAGNOSIS — Z96659 Presence of unspecified artificial knee joint: Secondary | ICD-10-CM

## 2013-02-09 DIAGNOSIS — S7291XA Unspecified fracture of right femur, initial encounter for closed fracture: Secondary | ICD-10-CM

## 2013-02-09 DIAGNOSIS — G4730 Sleep apnea, unspecified: Secondary | ICD-10-CM | POA: Diagnosis present

## 2013-02-09 DIAGNOSIS — I2510 Atherosclerotic heart disease of native coronary artery without angina pectoris: Secondary | ICD-10-CM | POA: Diagnosis present

## 2013-02-09 DIAGNOSIS — R339 Retention of urine, unspecified: Secondary | ICD-10-CM | POA: Diagnosis not present

## 2013-02-09 DIAGNOSIS — Z8546 Personal history of malignant neoplasm of prostate: Secondary | ICD-10-CM

## 2013-02-09 DIAGNOSIS — I10 Essential (primary) hypertension: Secondary | ICD-10-CM | POA: Diagnosis present

## 2013-02-09 DIAGNOSIS — E785 Hyperlipidemia, unspecified: Secondary | ICD-10-CM | POA: Diagnosis present

## 2013-02-09 DIAGNOSIS — Y92009 Unspecified place in unspecified non-institutional (private) residence as the place of occurrence of the external cause: Secondary | ICD-10-CM

## 2013-02-09 DIAGNOSIS — Z981 Arthrodesis status: Secondary | ICD-10-CM

## 2013-02-09 DIAGNOSIS — W010XXA Fall on same level from slipping, tripping and stumbling without subsequent striking against object, initial encounter: Secondary | ICD-10-CM | POA: Diagnosis present

## 2013-02-09 DIAGNOSIS — Z87891 Personal history of nicotine dependence: Secondary | ICD-10-CM

## 2013-02-09 DIAGNOSIS — J449 Chronic obstructive pulmonary disease, unspecified: Secondary | ICD-10-CM | POA: Diagnosis present

## 2013-02-09 DIAGNOSIS — J4489 Other specified chronic obstructive pulmonary disease: Secondary | ICD-10-CM | POA: Diagnosis present

## 2013-02-09 DIAGNOSIS — Z951 Presence of aortocoronary bypass graft: Secondary | ICD-10-CM

## 2013-02-09 DIAGNOSIS — Z954 Presence of other heart-valve replacement: Secondary | ICD-10-CM

## 2013-02-09 HISTORY — PX: FEMUR IM NAIL: SHX1597

## 2013-02-09 LAB — CBC WITH DIFFERENTIAL/PLATELET
Basophils Absolute: 0 10*3/uL (ref 0.0–0.1)
Basophils Relative: 0 % (ref 0–1)
Eosinophils Absolute: 0 10*3/uL (ref 0.0–0.7)
Eosinophils Relative: 0 % (ref 0–5)
HCT: 41.2 % (ref 39.0–52.0)
Hemoglobin: 13.2 g/dL (ref 13.0–17.0)
MCHC: 32 g/dL (ref 30.0–36.0)
MCV: 90.7 fL (ref 78.0–100.0)
Monocytes Absolute: 0.3 10*3/uL (ref 0.1–1.0)
Monocytes Relative: 3 % (ref 3–12)
Neutro Abs: 7.1 10*3/uL (ref 1.7–7.7)
RDW: 14.1 % (ref 11.5–15.5)
WBC: 7.9 10*3/uL (ref 4.0–10.5)

## 2013-02-09 LAB — BASIC METABOLIC PANEL
Calcium: 9.3 mg/dL (ref 8.4–10.5)
Creatinine, Ser: 1.12 mg/dL (ref 0.50–1.35)
GFR calc Af Amer: 65 mL/min — ABNORMAL LOW (ref >90)
GFR calc non Af Amer: 56 mL/min — ABNORMAL LOW (ref >90)

## 2013-02-09 LAB — PROTIME-INR: Prothrombin Time: 13.2 seconds (ref 11.6–15.2)

## 2013-02-09 LAB — TYPE AND SCREEN: ABO/RH(D): O POS

## 2013-02-09 SURGERY — INSERTION, INTRAMEDULLARY ROD, FEMUR, RETROGRADE
Anesthesia: General | Site: Leg Upper | Laterality: Right | Wound class: Clean

## 2013-02-09 MED ORDER — METOCLOPRAMIDE HCL 5 MG/ML IJ SOLN: 5.0000 mg | Freq: Three times a day (TID) | INTRAMUSCULAR | Status: DC | PRN

## 2013-02-09 MED ORDER — MOMETASONE FURO-FORMOTEROL FUM 100-5 MCG/ACT IN AERO
2.0000 | INHALATION_SPRAY | Freq: Two times a day (BID) | RESPIRATORY_TRACT | Status: DC
  Administered 2013-02-10 – 2013-02-12 (×6): 2 via RESPIRATORY_TRACT
  Filled 2013-02-09: qty 8.8

## 2013-02-09 MED ORDER — METHOCARBAMOL 100 MG/ML IJ SOLN
500.0000 mg | Freq: Four times a day (QID) | INTRAVENOUS | Status: DC | PRN
  Filled 2013-02-09: qty 5

## 2013-02-09 MED ORDER — ACETAMINOPHEN 650 MG RE SUPP: 650.0000 mg | Freq: Four times a day (QID) | RECTAL | Status: DC | PRN

## 2013-02-09 MED ORDER — BISACODYL 10 MG RE SUPP
10.0000 mg | Freq: Every day | RECTAL | Status: DC | PRN
  Administered 2013-02-12: 12:00:00 10 mg via RECTAL
  Filled 2013-02-09: qty 1

## 2013-02-09 MED ORDER — TAMSULOSIN HCL 0.4 MG PO CAPS
0.4000 mg | ORAL_CAPSULE | Freq: Every day | ORAL | Status: DC
  Administered 2013-02-10 – 2013-02-12 (×3): 0.4 mg via ORAL
  Filled 2013-02-09 (×3): qty 1

## 2013-02-09 MED ORDER — PHENOL 1.4 % MT LIQD
1.0000 | OROMUCOSAL | Status: DC | PRN
  Filled 2013-02-09: qty 177

## 2013-02-09 MED ORDER — SUCRALFATE 1 G PO TABS
1.0000 g | ORAL_TABLET | Freq: Four times a day (QID) | ORAL | Status: DC
  Administered 2013-02-10 – 2013-02-12 (×11): 1 g via ORAL
  Filled 2013-02-09 (×13): qty 1

## 2013-02-09 MED ORDER — ONDANSETRON HCL 4 MG/2ML IJ SOLN: 4.0000 mg | Freq: Four times a day (QID) | INTRAMUSCULAR | Status: DC | PRN

## 2013-02-09 MED ORDER — PREGABALIN 50 MG PO CAPS
50.0000 mg | ORAL_CAPSULE | Freq: Two times a day (BID) | ORAL | Status: DC
  Administered 2013-02-10 – 2013-02-12 (×5): 50 mg via ORAL
  Filled 2013-02-09 (×6): qty 1

## 2013-02-09 MED ORDER — GABAPENTIN 300 MG PO CAPS
300.0000 mg | ORAL_CAPSULE | Freq: Every day | ORAL | Status: DC
  Administered 2013-02-10 – 2013-02-11 (×3): 300 mg via ORAL
  Filled 2013-02-09 (×4): qty 1

## 2013-02-09 MED ORDER — FENTANYL CITRATE 0.05 MG/ML IJ SOLN
INTRAMUSCULAR | Status: DC | PRN
  Administered 2013-02-09: 50 ug via INTRAVENOUS
  Administered 2013-02-09: 100 ug via INTRAVENOUS
  Administered 2013-02-09 (×2): 50 ug via INTRAVENOUS

## 2013-02-09 MED ORDER — CEFAZOLIN SODIUM-DEXTROSE 2-3 GM-% IV SOLR
INTRAVENOUS | Status: AC
  Filled 2013-02-09: qty 50

## 2013-02-09 MED ORDER — HYDROCODONE-ACETAMINOPHEN 5-325 MG PO TABS
1.0000 | ORAL_TABLET | ORAL | Status: DC | PRN
  Administered 2013-02-10: 2 via ORAL
  Administered 2013-02-10 – 2013-02-11 (×2): 1 via ORAL
  Filled 2013-02-09: qty 2
  Filled 2013-02-09 (×2): qty 1

## 2013-02-09 MED ORDER — METOCLOPRAMIDE HCL 5 MG/ML IJ SOLN
INTRAMUSCULAR | Status: DC | PRN
  Administered 2013-02-09: 10 mg via INTRAVENOUS

## 2013-02-09 MED ORDER — MENTHOL 3 MG MT LOZG
1.0000 | LOZENGE | OROMUCOSAL | Status: DC | PRN
  Filled 2013-02-09: qty 9

## 2013-02-09 MED ORDER — POTASSIUM CHLORIDE IN NACL 20-0.9 MEQ/L-% IV SOLN
INTRAVENOUS | Status: DC
  Administered 2013-02-10 – 2013-02-11 (×3): via INTRAVENOUS
  Filled 2013-02-09 (×5): qty 1000

## 2013-02-09 MED ORDER — ONDANSETRON HCL 4 MG PO TABS: 4.0000 mg | ORAL_TABLET | Freq: Four times a day (QID) | ORAL | Status: DC | PRN

## 2013-02-09 MED ORDER — ACETAMINOPHEN 325 MG PO TABS
650.0000 mg | ORAL_TABLET | Freq: Four times a day (QID) | ORAL | Status: DC | PRN
  Administered 2013-02-10 – 2013-02-11 (×2): 650 mg via ORAL
  Filled 2013-02-09 (×2): qty 2

## 2013-02-09 MED ORDER — METOCLOPRAMIDE HCL 10 MG PO TABS: 5.0000 mg | ORAL_TABLET | Freq: Three times a day (TID) | ORAL | Status: DC | PRN

## 2013-02-09 MED ORDER — ENOXAPARIN SODIUM 30 MG/0.3ML ~~LOC~~ SOLN
30.0000 mg | Freq: Two times a day (BID) | SUBCUTANEOUS | Status: DC
  Administered 2013-02-10 – 2013-02-12 (×5): 30 mg via SUBCUTANEOUS
  Filled 2013-02-09 (×6): qty 0.3

## 2013-02-09 MED ORDER — MORPHINE SULFATE 2 MG/ML IJ SOLN
2.0000 mg | INTRAMUSCULAR | Status: DC | PRN
  Administered 2013-02-09: 2 mg via INTRAVENOUS
  Filled 2013-02-09: qty 1

## 2013-02-09 MED ORDER — SUCCINYLCHOLINE CHLORIDE 20 MG/ML IJ SOLN
INTRAMUSCULAR | Status: DC | PRN
  Administered 2013-02-09: 100 mg via INTRAVENOUS

## 2013-02-09 MED ORDER — PHENYLEPHRINE HCL 10 MG/ML IJ SOLN
INTRAMUSCULAR | Status: DC | PRN
  Administered 2013-02-09 (×3): 120 ug via INTRAVENOUS
  Administered 2013-02-09 (×2): 80 ug via INTRAVENOUS

## 2013-02-09 MED ORDER — ATORVASTATIN CALCIUM 40 MG PO TABS
40.0000 mg | ORAL_TABLET | Freq: Every day | ORAL | Status: DC
  Administered 2013-02-10 – 2013-02-11 (×2): 40 mg via ORAL
  Filled 2013-02-09 (×3): qty 1

## 2013-02-09 MED ORDER — CEFAZOLIN SODIUM-DEXTROSE 2-3 GM-% IV SOLR
2.0000 g | Freq: Four times a day (QID) | INTRAVENOUS | Status: AC
  Administered 2013-02-10 (×2): 2 g via INTRAVENOUS
  Filled 2013-02-09 (×2): qty 50

## 2013-02-09 MED ORDER — ALBUTEROL SULFATE HFA 108 (90 BASE) MCG/ACT IN AERS
2.0000 | INHALATION_SPRAY | RESPIRATORY_TRACT | Status: DC | PRN
  Filled 2013-02-09: qty 6.7

## 2013-02-09 MED ORDER — 0.9 % SODIUM CHLORIDE (POUR BTL) OPTIME
TOPICAL | Status: DC | PRN
  Administered 2013-02-09: 1000 mL

## 2013-02-09 MED ORDER — SODIUM CHLORIDE 0.9 % IV SOLN: INTRAVENOUS | Status: DC

## 2013-02-09 MED ORDER — CEFAZOLIN SODIUM-DEXTROSE 2-3 GM-% IV SOLR
2.0000 g | INTRAVENOUS | Status: AC
  Administered 2013-02-09: 2 g via INTRAVENOUS

## 2013-02-09 MED ORDER — ONDANSETRON HCL 4 MG/2ML IJ SOLN
INTRAMUSCULAR | Status: DC | PRN
  Administered 2013-02-09: 4 mg via INTRAVENOUS

## 2013-02-09 MED ORDER — FERROUS SULFATE 325 (65 FE) MG PO TABS
325.0000 mg | ORAL_TABLET | Freq: Three times a day (TID) | ORAL | Status: DC
  Administered 2013-02-10 – 2013-02-12 (×8): 325 mg via ORAL
  Filled 2013-02-09 (×10): qty 1

## 2013-02-09 MED ORDER — HYDROMORPHONE HCL PF 1 MG/ML IJ SOLN: 0.2500 mg | INTRAMUSCULAR | Status: DC | PRN

## 2013-02-09 MED ORDER — CLONAZEPAM 0.5 MG PO TABS
0.5000 mg | ORAL_TABLET | Freq: Every evening | ORAL | Status: DC | PRN
  Administered 2013-02-10: 0.5 mg via ORAL
  Filled 2013-02-09: qty 1

## 2013-02-09 MED ORDER — SODIUM CHLORIDE 0.9 % IV SOLN
INTRAVENOUS | Status: DC | PRN
  Administered 2013-02-09: 17:00:00 via INTRAVENOUS

## 2013-02-09 MED ORDER — AMLODIPINE BESYLATE 10 MG PO TABS
10.0000 mg | ORAL_TABLET | Freq: Every day | ORAL | Status: DC
  Administered 2013-02-11 – 2013-02-12 (×2): 10 mg via ORAL
  Filled 2013-02-09 (×3): qty 1

## 2013-02-09 MED ORDER — PROPOFOL 10 MG/ML IV BOLUS
INTRAVENOUS | Status: DC | PRN
  Administered 2013-02-09: 100 mg via INTRAVENOUS
  Administered 2013-02-09: 50 mg via INTRAVENOUS

## 2013-02-09 MED ORDER — FUROSEMIDE 40 MG PO TABS
40.0000 mg | ORAL_TABLET | Freq: Every day | ORAL | Status: DC
  Administered 2013-02-10 – 2013-02-12 (×3): 40 mg via ORAL
  Filled 2013-02-09 (×3): qty 1

## 2013-02-09 MED ORDER — ONDANSETRON HCL 4 MG/2ML IJ SOLN
4.0000 mg | Freq: Four times a day (QID) | INTRAMUSCULAR | Status: DC | PRN
  Administered 2013-02-09: 4 mg via INTRAVENOUS
  Filled 2013-02-09: qty 2

## 2013-02-09 MED ORDER — ALUM & MAG HYDROXIDE-SIMETH 200-200-20 MG/5ML PO SUSP: 30.0000 mL | ORAL | Status: DC | PRN

## 2013-02-09 MED ORDER — EPHEDRINE SULFATE 50 MG/ML IJ SOLN
INTRAMUSCULAR | Status: DC | PRN
  Administered 2013-02-09: 10 mg via INTRAVENOUS
  Administered 2013-02-09: 5 mg via INTRAVENOUS

## 2013-02-09 MED ORDER — TIOTROPIUM BROMIDE MONOHYDRATE 18 MCG IN CAPS
18.0000 ug | ORAL_CAPSULE | Freq: Every day | RESPIRATORY_TRACT | Status: DC
  Administered 2013-02-10 – 2013-02-12 (×3): 18 ug via RESPIRATORY_TRACT
  Filled 2013-02-09: qty 5

## 2013-02-09 MED ORDER — ATENOLOL 50 MG PO TABS
50.0000 mg | ORAL_TABLET | Freq: Every day | ORAL | Status: DC
  Administered 2013-02-10 – 2013-02-12 (×3): 50 mg via ORAL
  Filled 2013-02-09 (×3): qty 1

## 2013-02-09 MED ORDER — POTASSIUM CHLORIDE CRYS ER 20 MEQ PO TBCR
40.0000 meq | EXTENDED_RELEASE_TABLET | Freq: Every day | ORAL | Status: DC
  Administered 2013-02-10 – 2013-02-12 (×3): 40 meq via ORAL
  Filled 2013-02-09 (×3): qty 2

## 2013-02-09 MED ORDER — MORPHINE SULFATE 4 MG/ML IJ SOLN
4.0000 mg | Freq: Once | INTRAMUSCULAR | Status: AC
  Administered 2013-02-09: 4 mg via INTRAVENOUS
  Filled 2013-02-09: qty 1

## 2013-02-09 MED ORDER — PREDNISONE 10 MG PO TABS
10.0000 mg | ORAL_TABLET | Freq: Every day | ORAL | Status: DC
  Administered 2013-02-10 – 2013-02-12 (×3): 10 mg via ORAL
  Filled 2013-02-09 (×3): qty 1

## 2013-02-09 MED ORDER — LACTATED RINGERS IV SOLN
INTRAVENOUS | Status: DC | PRN
  Administered 2013-02-09: 21:00:00 via INTRAVENOUS

## 2013-02-09 MED ORDER — LACTATED RINGERS IV SOLN: INTRAVENOUS | Status: DC

## 2013-02-09 MED ORDER — DIPHENOXYLATE-ATROPINE 2.5-0.025 MG PO TABS: 1.0000 | ORAL_TABLET | Freq: Four times a day (QID) | ORAL | Status: DC | PRN

## 2013-02-09 MED ORDER — PANTOPRAZOLE SODIUM 40 MG PO TBEC
40.0000 mg | DELAYED_RELEASE_TABLET | Freq: Every day | ORAL | Status: DC
  Administered 2013-02-10 – 2013-02-12 (×3): 40 mg via ORAL
  Filled 2013-02-09 (×3): qty 1

## 2013-02-09 MED ORDER — PHENYLEPHRINE HCL 10 MG/ML IJ SOLN
20.0000 mg | INTRAVENOUS | Status: DC | PRN
  Administered 2013-02-09: 50 ug/min via INTRAVENOUS

## 2013-02-09 MED ORDER — METHOCARBAMOL 500 MG PO TABS
500.0000 mg | ORAL_TABLET | Freq: Four times a day (QID) | ORAL | Status: DC | PRN
  Administered 2013-02-10: 500 mg via ORAL
  Filled 2013-02-09: qty 1

## 2013-02-09 MED ORDER — MORPHINE SULFATE 2 MG/ML IJ SOLN: 2.0000 mg | INTRAMUSCULAR | Status: DC | PRN

## 2013-02-09 SURGICAL SUPPLY — 52 items
BAG ZIPLOCK 12X15 (MISCELLANEOUS) ×2 IMPLANT
BANDAGE ELASTIC 6 VELCRO ST LF (GAUZE/BANDAGES/DRESSINGS) ×4 IMPLANT
BANDAGE GAUZE ELAST BULKY 4 IN (GAUZE/BANDAGES/DRESSINGS) ×2 IMPLANT
BIT DRILL CALIBRATED 4.3MMX365 (DRILL) ×1 IMPLANT
BIT DRILL CROWE PNT TWST 4.5MM (DRILL) ×1 IMPLANT
BLADE SURG 15 STRL LF DISP TIS (BLADE) ×1 IMPLANT
BLADE SURG 15 STRL SS (BLADE) ×1
CLOTH BEACON ORANGE TIMEOUT ST (SAFETY) ×2 IMPLANT
DRAPE C-ARM 42X120 X-RAY (DRAPES) ×2 IMPLANT
DRAPE C-ARMOR (DRAPES) ×2 IMPLANT
DRAPE LG THREE QUARTER DISP (DRAPES) ×4 IMPLANT
DRAPE ORTHO SPLIT 77X108 STRL (DRAPES) ×2
DRAPE STERI IOBAN 125X83 (DRAPES) ×2 IMPLANT
DRAPE SURG ORHT 6 SPLT 77X108 (DRAPES) ×2 IMPLANT
DRAPE TABLE BACK 44X90 PK DISP (DRAPES) ×2 IMPLANT
DRAPE U-SHAPE 47X51 STRL (DRAPES) ×2 IMPLANT
DRILL CALIBRATED 4.3MMX365 (DRILL) ×2
DRILL CROWE POINT TWIST 4.5MM (DRILL) ×2
DRSG ADAPTIC 3X8 NADH LF (GAUZE/BANDAGES/DRESSINGS) ×2 IMPLANT
DRSG PAD ABDOMINAL 8X10 ST (GAUZE/BANDAGES/DRESSINGS) ×2 IMPLANT
DURAPREP 26ML APPLICATOR (WOUND CARE) ×2 IMPLANT
ELECT REM PT RETURN 9FT ADLT (ELECTROSURGICAL) ×2
ELECTRODE REM PT RTRN 9FT ADLT (ELECTROSURGICAL) ×1 IMPLANT
GLOVE ORTHO TXT STRL SZ7.5 (GLOVE) ×2 IMPLANT
GLOVE SURG ORTHO 8.5 STRL (GLOVE) ×2 IMPLANT
GOWN PREVENTION PLUS LG XLONG (DISPOSABLE) ×4 IMPLANT
GUIDEPIN 3.2X17.5 THRD DISP (PIN) ×2 IMPLANT
GUIDEWIRE BEAD TIP (WIRE) ×2 IMPLANT
KIT BASIN OR (CUSTOM PROCEDURE TRAY) ×2 IMPLANT
MANIFOLD NEPTUNE II (INSTRUMENTS) ×2 IMPLANT
NAIL FEM RETRO 12X380 (Nail) ×2 IMPLANT
NS IRRIG 1000ML POUR BTL (IV SOLUTION) ×2 IMPLANT
PACK GENERAL/GYN (CUSTOM PROCEDURE TRAY) IMPLANT
PACK TOTAL JOINT (CUSTOM PROCEDURE TRAY) ×2 IMPLANT
PADDING CAST COTTON 6X4 STRL (CAST SUPPLIES) ×4 IMPLANT
PENCIL BUTTON HOLSTER BLD 10FT (ELECTRODE) ×2 IMPLANT
POSITIONER SURGICAL ARM (MISCELLANEOUS) ×2 IMPLANT
SCREW CORT TI DBL LEAD 5X34 (Screw) ×2 IMPLANT
SCREW CORT TI DBL LEAD 5X58 (Screw) ×2 IMPLANT
SCREW CORT TI DBL LEAD 5X65 (Screw) ×2 IMPLANT
SCREW CORT TI DBL LEAD 5X75 (Screw) ×2 IMPLANT
SCREW CORT TI DBL LEAD 5X90 (Screw) ×2 IMPLANT
SET PAD KNEE POSITIONER (MISCELLANEOUS) IMPLANT
SPONGE GAUZE 4X4 12PLY (GAUZE/BANDAGES/DRESSINGS) ×2 IMPLANT
STAPLER VISISTAT 35W (STAPLE) ×2 IMPLANT
SUT VIC AB 0 CT1 27 (SUTURE) ×1
SUT VIC AB 0 CT1 27XBRD ANTBC (SUTURE) ×1 IMPLANT
SUT VIC AB 2-0 CT1 27 (SUTURE) ×2
SUT VIC AB 2-0 CT1 TAPERPNT 27 (SUTURE) ×2 IMPLANT
TOWEL OR 17X26 10 PK STRL BLUE (TOWEL DISPOSABLE) ×6 IMPLANT
TRAY FOLEY CATH 14FRSI W/METER (CATHETERS) ×2 IMPLANT
WATER STERILE IRR 1500ML POUR (IV SOLUTION) ×2 IMPLANT

## 2013-02-09 NOTE — Anesthesia Preprocedure Evaluation (Addendum)
Anesthesia Evaluation  Patient identified by MRN, date of birth, ID band Patient awake    Reviewed: Allergy & Precautions, H&P , NPO status , Patient's Chart, lab work & pertinent test results, reviewed documented beta blocker date and time   Airway Mallampati: II TM Distance: >3 FB Neck ROM: full    Dental  (+) Edentulous Upper, Edentulous Lower and Dental Advisory Given   Pulmonary shortness of breath and with exertion, sleep apnea , COPD COPD inhaler,  breath sounds clear to auscultation  Pulmonary exam normal       Cardiovascular Exercise Tolerance: Poor hypertension, Pt. on home beta blockers and Pt. on medications + DOE Rhythm:regular Rate:Normal  AVR   Neuro/Psych negative neurological ROS  negative psych ROS   GI/Hepatic negative GI ROS, Neg liver ROS,   Endo/Other  negative endocrine ROS  Renal/GU negative Renal ROS  negative genitourinary   Musculoskeletal   Abdominal   Peds  Hematology negative hematology ROS (+)   Anesthesia Other Findings   Reproductive/Obstetrics negative OB ROS                          Anesthesia Physical Anesthesia Plan  ASA: III  Anesthesia Plan: General   Post-op Pain Management:    Induction: Intravenous  Airway Management Planned: Oral ETT  Additional Equipment:   Intra-op Plan:   Post-operative Plan: Extubation in OR  Informed Consent: I have reviewed the patients History and Physical, chart, labs and discussed the procedure including the risks, benefits and alternatives for the proposed anesthesia with the patient or authorized representative who has indicated his/her understanding and acceptance.   Dental Advisory Given  Plan Discussed with: CRNA and Surgeon  Anesthesia Plan Comments:         Anesthesia Quick Evaluation

## 2013-02-09 NOTE — H&P (Signed)
[name] [name] is an 79 y.o. male.    Chief Complaint: right leg pain s/p fall  HPI: 79 y/o male with history of right total knee arthroplasty sustained a ground level fall earlier today injuring right leg. Radiographs show a minimally displaced distal femur fracture just proximal to femoral component of total knee. Denies any other injuries or LOC. Pt lives at home alone and is very functional. Denies any other symptoms or issues  PCP:  Minda Meo, MD  PMH: Past Medical History  Diagnosis Date  . COPD (chronic obstructive pulmonary disease)   . Sleep apnea   . Hyperlipidemia   . Hypertension   . Cancer     prostate cancer    PSH: Past Surgical History  Procedure Laterality Date  . Aortic valve replacement      surgery prior to 2004, bovine  . Cholecystectomy    . Shoulder surgery  June 2011    left  . Prostate surgery      prostate removed  . Joint replacement Bilateral   . Flexible sigmoidoscopy N/A 07/26/2012    Procedure: FLEXIBLE SIGMOIDOSCOPY;  Surgeon: Vertell Novak., MD;  Location: [facility];  Service: Endoscopy;  Laterality: N/A;    Social History:  reports that he has quit smoking. He has never used smokeless tobacco. He reports that he does not drink alcohol. His drug history is not on file.  Allergies:  No Known Allergies  Medications: No current facility-administered medications for this encounter.   Current Outpatient Prescriptions  Medication Sig Dispense Refill  . amLODipine (NORVASC) 10 MG tablet Take 10 mg by mouth daily.      Marland Kitchen aspirin 81 MG tablet Take 81 mg by mouth daily.        Marland Kitchen atenolol (TENORMIN) 50 MG tablet Take 50 mg by mouth daily.        . clonazePAM (KLONOPIN) 0.5 MG tablet Take 0.5 mg by mouth at bedtime as needed (sleep). prn      . diphenoxylate-atropine (LOMOTIL) 2.5-0.025 MG per tablet Take 1-2 tablets by mouth 4 (four) times daily as needed for diarrhea or loose stools.       . furosemide (LASIX) 40 MG tablet  Take 40 mg by mouth daily.      Marland Kitchen gabapentin (NEURONTIN) 300 MG capsule Take 300 mg by mouth at bedtime.      Marland Kitchen HYDROcodone-acetaminophen (NORCO/VICODIN) 5-325 MG per tablet Take 1 tablet by mouth every 6 (six) hours as needed for pain.      Marland Kitchen omeprazole (PRILOSEC) 20 MG capsule Take 20 mg by mouth daily.      . potassium chloride SA (K-DUR,KLOR-CON) 20 MEQ tablet Take 2 tablets (40 mEq total) by mouth daily.      . predniSONE (DELTASONE) 10 MG tablet Take 10 mg by mouth daily.      . pregabalin (LYRICA) 50 MG capsule Take 50 mg by mouth 2 (two) times daily.      . simvastatin (ZOCOR) 80 MG tablet Take 40 mg by mouth daily. Take 1/2 tablet      . sucralfate (CARAFATE) 1 G tablet Take 1 g by mouth 4 (four) times daily.      . tamsulosin (FLOMAX) 0.4 MG CAPS capsule Take 0.4 mg by mouth daily.      Marland Kitchen tiotropium (SPIRIVA) 18 MCG inhalation capsule Place 1 capsule (18 mcg total) into inhaler and inhale daily.  30 capsule  0  . VENTOLIN HFA 108 (90 BASE) MCG/ACT inhaler  USE 2 PUFFS 4 TIMES A DAY AS NEEDED FOR RESCUE.  18 g  PRN  . mometasone-formoterol (DULERA) 100-5 MCG/ACT AERO Inhale 2 puffs into the lungs 2 (two) times daily. Rinse mouth  1 Inhaler  prn    Results for orders placed during the hospital encounter of 02/09/13 (from the past 48 hour(s))  CBC WITH DIFFERENTIAL     Status: Abnormal   Collection Time    02/09/13  5:15 PM      Result Value Range   WBC 7.9  4.0 - 10.5 K/uL   RBC 4.54  4.22 - 5.81 MIL/uL   Hemoglobin 13.2  13.0 - 17.0 g/dL   HCT 40.9  81.1 - 91.4 %   MCV 90.7  78.0 - 100.0 fL   MCH 29.1  26.0 - 34.0 pg   MCHC 32.0  30.0 - 36.0 g/dL   RDW 78.2  95.6 - 21.3 %   Platelets 153  150 - 400 K/uL   Neutrophils Relative % 91 (*) 43 - 77 %   Neutro Abs 7.1  1.7 - 7.7 K/uL   Lymphocytes Relative 6 (*) 12 - 46 %   Lymphs Abs 0.5 (*) 0.7 - 4.0 K/uL   Monocytes Relative 3  3 - 12 %   Monocytes Absolute 0.3  0.1 - 1.0 K/uL   Eosinophils Relative 0  0 - 5 %   Eosinophils  Absolute 0.0  0.0 - 0.7 K/uL   Basophils Relative 0  0 - 1 %   Basophils Absolute 0.0  0.0 - 0.1 K/uL  BASIC METABOLIC PANEL     Status: Abnormal   Collection Time    02/09/13  5:15 PM      Result Value Range   Sodium 137  135 - 145 mEq/L   Potassium 4.3  3.5 - 5.1 mEq/L   Chloride 98  96 - 112 mEq/L   CO2 28  19 - 32 mEq/L   Glucose, Bld 178 (*) 70 - 99 mg/dL   BUN 23  6 - 23 mg/dL   Creatinine, Ser 0.86  0.50 - 1.35 mg/dL   Calcium 9.3  8.4 - 57.8 mg/dL   GFR calc non Af Amer 56 (*) >90 mL/min   GFR calc Af Amer 65 (*) >90 mL/min   Comment: (NOTE)     The eGFR has been calculated using the CKD EPI equation.     This calculation has not been validated in all clinical situations.     eGFR's persistently <90 mL/min signify possible Chronic Kidney     Disease.  PROTIME-INR     Status: None   Collection Time    02/09/13  5:15 PM      Result Value Range   Prothrombin Time 13.2  11.6 - 15.2 seconds   INR 1.02  0.00 - 1.49   Ct Head Wo Contrast  02/09/2013   CLINICAL DATA:  Fall.  EXAM: CT HEAD WITHOUT CONTRAST  TECHNIQUE: Contiguous axial images were obtained from the base of the skull through the vertex without intravenous contrast.  COMPARISON:  02/23/2010.  FINDINGS: No skull fracture or intracranial hemorrhage.  Small vessel disease type changes without CT evidence of large acute infarct.  Global atrophy without hydrocephalus.  No intracranial mass lesion noted on this unenhanced exam.  .  Vascular calcifications.  Partial opacification left maxillary sinus.  Orbital structures appear to be grossly intact.  IMPRESSION: No skull fracture or intracranial hemorrhage.   Electronically Signed  By: Bridgett Larsson M.D.   On: 02/09/2013 15:48   Dg Knee Complete 4 Views Right  02/09/2013   CLINICAL DATA:  Fall. Right knee pain, injury.  EXAM: RIGHT KNEE - COMPLETE 4+ VIEW  COMPARISON:  None.  FINDINGS: Changes of right knee replacement. There is a fracture through the distal right femoral  metaphysis above the femoral component of the knee replacement. Associated moderate joint effusion. Fracture is minimally displaced.  IMPRESSION: Fracture through the distal right femoral metaphysis above the femoral component of the total knee replacement. Moderate joint effusion.   Electronically Signed   By: Charlett Nose M.D.   On: 02/09/2013 15:50    ROS: Unable to walk currently Otherwise ros negative ROS  Physical Exam: Vitals signs stable Alert and appropriate 80 y/o male in no acute distress Bilateral upper extremities show full rom without deformity nv intact distally Mild tenderness with rom of right knee with no signs of open injury nv intact distally Full exam not completed on right lower extremity due to known fracture Left lower extremity shows full rom with no deformity Physical Exam    X-rays: minimally displaced right femur fracture just proximal to femoral component of total knee arthroplasty  Assessment/Plan Assessment: right femur fracture  Plan: Admit for surgical management later tonight for the femur fracture Pt has been NPO since breakfast this am Pain control Strict non weight bearing right lower extremity Foley     @MSBSIG @

## 2013-02-09 NOTE — ED Notes (Signed)
Bed: ZO10 Expected date:  Expected time:  Means of arrival:  Comments: ems- fall

## 2013-02-09 NOTE — Brief Op Note (Signed)
02/09/2013  9:54 PM  PATIENT:  Bernarda Caffey  79 y.o. male  PRE-OPERATIVE DIAGNOSIS:  right femur fracture, distal, periprosthetic  POST-OPERATIVE DIAGNOSIS:  right femur fracture, distal, periprosthetic  PROCEDURE:  Procedure(s): INTRAMEDULLARY (IM) RETROGRADE FEMORAL NAILING (Right), [employer] [place] 12mm x 38cm retrograde nail  SURGEON:  Surgeon(s) and Role:    * Verlee Rossetti, MD - Primary  PHYSICIAN ASSISTANT:   ASSISTANTS: Thea Gist, PA-C   ANESTHESIA:   general  EBL:  Total I/O In: 1000 [I.V.:1000] Out: 850 [Urine:800; Blood:50]  BLOOD ADMINISTERED:none  DRAINS: none   LOCAL MEDICATIONS USED:  NONE  SPECIMEN:  No Specimen  DISPOSITION OF SPECIMEN:  N/A  COUNTS:  YES  TOURNIQUET:  * No tourniquets in log *  DICTATION: .Other Dictation: Dictation Number [id number]  PLAN OF CARE: Admit to inpatient   PATIENT DISPOSITION:  PACU - hemodynamically stable.   Delay start of Pharmacological VTE agent (>24hrs) due to surgical blood loss or risk of bleeding: no

## 2013-02-09 NOTE — ED Notes (Signed)
Per ems: pt from home, tripped over oxygen tubing and fell. Minor swelling and abrasion noted to pt's head, c/o right knee pain. Knee deformed. Pt reports taking an oxycodone at home before calling ems.  bp 124/80, pulse 72

## 2013-02-09 NOTE — Preoperative (Signed)
Beta Blockers   Reason not to administer Beta Blockers:Not Applicable, took BB within 24 hours

## 2013-02-09 NOTE — Transfer of Care (Signed)
Immediate Anesthesia Transfer of Care Note  Patient: [name] [name]  Procedure(s) Performed: Procedure(s): INTRAMEDULLARY (IM) RETROGRADE FEMORAL NAILING (Right)  Patient Location: PACU  Anesthesia Type:General  Level of Consciousness: Patient easily awoken, sedated, comfortable, cooperative, following commands, responds to stimulation.   Airway & Oxygen Therapy: Patient spontaneously breathing, ventilating well, oxygen via simple oxygen mask.  Post-op Assessment: Report given to PACU RN, vital signs reviewed and stable, moving all extremities.   Post vital signs: Reviewed and stable.  Complications: No apparent anesthesia complications

## 2013-02-09 NOTE — ED Provider Notes (Signed)
CSN: [id number]     Arrival date & time 02/09/13  1458 History   First MD Initiated Contact with Patient 02/09/13 1514     Chief Complaint  Patient presents with  . Knee Injury   (Consider location/radiation/quality/duration/timing/severity/associated sxs/prior Treatment) The history is provided by the patient.  [name] [name] is a 78 y.o. male hx of COPD, HL, HTN here presenting after a fall. He was at home and tripped over her oxygen tubing and fell and hit his head as well as the right knee. He has some swelling on the right forehead but has no headache. He is complaining of right knee pain. States that he was unable to bear weight on the right knee afterwards but denies any hip pain. He is not on any Plavix or Coumadin and denies any neck or back pain or chest or abdominal pain.    Past Medical History  Diagnosis Date  . COPD (chronic obstructive pulmonary disease)   . Sleep apnea   . Hyperlipidemia   . Hypertension   . Cancer     prostate cancer   Past Surgical History  Procedure Laterality Date  . Aortic valve replacement      surgery prior to 2004, bovine  . Cholecystectomy    . Shoulder surgery  June 2011    left  . Prostate surgery      prostate removed  . Joint replacement Bilateral   . Flexible sigmoidoscopy N/A 07/26/2012    Procedure: FLEXIBLE SIGMOIDOSCOPY;  Surgeon: Vertell Novak., MD;  Location: [facility];  Service: Endoscopy;  Laterality: N/A;   Family History  Problem Relation Age of Onset  . COPD Brother   . Stroke Father   . Diabetes Father    History  Substance Use Topics  . Smoking status: Former Games developer  . Smokeless tobacco: Never Used  . Alcohol Use: No     Comment: Occasional Alcohol    Review of Systems  Musculoskeletal:       R knee pain   All other systems reviewed and are negative.    Allergies  Review of patient's allergies indicates no known allergies.  Home Medications   Current Outpatient Rx  Name  Route  Sig   Dispense  Refill  . amLODipine (NORVASC) 10 MG tablet   Oral   Take 10 mg by mouth daily.         Marland Kitchen aspirin 81 MG tablet   Oral   Take 81 mg by mouth daily.           Marland Kitchen atenolol (TENORMIN) 50 MG tablet   Oral   Take 50 mg by mouth daily.           . clonazePAM (KLONOPIN) 0.5 MG tablet   Oral   Take 0.5 mg by mouth at bedtime as needed (sleep). prn         . diphenoxylate-atropine (LOMOTIL) 2.5-0.025 MG per tablet   Oral   Take 1-2 tablets by mouth 4 (four) times daily as needed for diarrhea or loose stools.          . furosemide (LASIX) 40 MG tablet   Oral   Take 40 mg by mouth daily.         Marland Kitchen gabapentin (NEURONTIN) 300 MG capsule   Oral   Take 300 mg by mouth at bedtime.         Marland Kitchen HYDROcodone-acetaminophen (NORCO/VICODIN) 5-325 MG per tablet   Oral   Take 1 tablet  by mouth every 6 (six) hours as needed for pain.         Marland Kitchen omeprazole (PRILOSEC) 20 MG capsule   Oral   Take 20 mg by mouth daily.         . potassium chloride SA (K-DUR,KLOR-CON) 20 MEQ tablet   Oral   Take 2 tablets (40 mEq total) by mouth daily.         . predniSONE (DELTASONE) 10 MG tablet   Oral   Take 10 mg by mouth daily.         . pregabalin (LYRICA) 50 MG capsule   Oral   Take 50 mg by mouth 2 (two) times daily.         . simvastatin (ZOCOR) 80 MG tablet   Oral   Take 40 mg by mouth daily. Take 1/2 tablet         . sucralfate (CARAFATE) 1 G tablet   Oral   Take 1 g by mouth 4 (four) times daily.         . tamsulosin (FLOMAX) 0.4 MG CAPS capsule   Oral   Take 0.4 mg by mouth daily.         Marland Kitchen tiotropium (SPIRIVA) 18 MCG inhalation capsule   Inhalation   Place 1 capsule (18 mcg total) into inhaler and inhale daily.   30 capsule   0   . VENTOLIN HFA 108 (90 BASE) MCG/ACT inhaler      USE 2 PUFFS 4 TIMES A DAY AS NEEDED FOR RESCUE.   18 g   PRN   . EXPIRED: mometasone-formoterol (DULERA) 100-5 MCG/ACT AERO   Inhalation   Inhale 2 puffs into the  lungs 2 (two) times daily. Rinse mouth   1 Inhaler   prn    There were no vitals taken for this visit. Physical Exam  Nursing note and vitals reviewed. Constitutional: He is oriented to person, place, and time.  Chronically ill, slightly uncomfortable   HENT:  Head: Normocephalic.  Mouth/Throat: Oropharynx is clear and moist.  Abrasion R forehead, small hematoma   Eyes: Conjunctivae and EOM are normal. Pupils are equal, round, and reactive to light.  Neck: Normal range of motion. Neck supple.  Cardiovascular: Normal rate, regular rhythm and normal heart sounds.   Pulmonary/Chest: Effort normal and breath sounds normal. No respiratory distress. He has no wheezes. He has no rales.  Abdominal: Soft. Bowel sounds are normal. He exhibits no distension. There is no tenderness. There is no rebound.  Musculoskeletal:  Bilateral hips nl ROM. R knee swollen with limited ROM. 2+ pedal pulses. No femur or tib/fib tenderness.   Neurological: He is alert and oriented to person, place, and time.  Skin: Skin is warm and dry.  Psychiatric: He has a normal mood and affect. His behavior is normal. Judgment and thought content normal.    ED Course  Procedures (including critical care time) Labs Review Labs Reviewed  CBC WITH DIFFERENTIAL - Abnormal; Notable for the following:    Neutrophils Relative % 91 (*)    Lymphocytes Relative 6 (*)    Lymphs Abs 0.5 (*)    All other components within normal limits  BASIC METABOLIC PANEL - Abnormal; Notable for the following:    Glucose, Bld 178 (*)    GFR calc non Af Amer 56 (*)    GFR calc Af Amer 65 (*)    All other components within normal limits  PROTIME-INR  TYPE AND SCREEN  ABO/RH  Imaging Review Ct Head Wo Contrast  02/09/2013   CLINICAL DATA:  Fall.  EXAM: CT HEAD WITHOUT CONTRAST  TECHNIQUE: Contiguous axial images were obtained from the base of the skull through the vertex without intravenous contrast.  COMPARISON:  02/23/2010.  FINDINGS:  No skull fracture or intracranial hemorrhage.  Small vessel disease type changes without CT evidence of large acute infarct.  Global atrophy without hydrocephalus.  No intracranial mass lesion noted on this unenhanced exam.  .  Vascular calcifications.  Partial opacification left maxillary sinus.  Orbital structures appear to be grossly intact.  IMPRESSION: No skull fracture or intracranial hemorrhage.   Electronically Signed   By: Bridgett Larsson M.D.   On: 02/09/2013 15:48   Dg Knee Complete 4 Views Right  02/09/2013   CLINICAL DATA:  Fall. Right knee pain, injury.  EXAM: RIGHT KNEE - COMPLETE 4+ VIEW  COMPARISON:  None.  FINDINGS: Changes of right knee replacement. There is a fracture through the distal right femoral metaphysis above the femoral component of the knee replacement. Associated moderate joint effusion. Fracture is minimally displaced.  IMPRESSION: Fracture through the distal right femoral metaphysis above the femoral component of the total knee replacement. Moderate joint effusion.   Electronically Signed   By: Charlett Nose M.D.   On: 02/09/2013 15:50    EKG Interpretation   None       MDM  No diagnosis found. TYHIR SCHWAN is a 79 y.o. male here with fall. Not syncope. Will get CT head, R knee xray.   4:30 PM Xray showed distal R femoral metaphysis fracture. I called Dr. Ranell Patrick who will evaluate patient.   6:32 PM Dr. Ranell Patrick will admit for operative management.     Richardean Canal, MD 02/09/13 [phone]

## 2013-02-09 NOTE — Anesthesia Postprocedure Evaluation (Signed)
  Anesthesia Post-op Note  Patient: [name] [name]  Procedure(s) Performed: Procedure(s) (LRB): INTRAMEDULLARY (IM) RETROGRADE FEMORAL NAILING (Right)  Patient Location: PACU  Anesthesia Type: General  Level of Consciousness: awake and alert   Airway and Oxygen Therapy: Patient Spontanous Breathing  Post-op Pain: mild  Post-op Assessment: Post-op Vital signs reviewed, Patient's Cardiovascular Status Stable, Respiratory Function Stable, Patent Airway and No signs of Nausea or vomiting  Last Vitals:  Filed Vitals:   02/09/13 1851  BP: 114/67  Pulse: 69  Temp: 36.9 C  Resp: 20    Post-op Vital Signs: stable   Complications: No apparent anesthesia complications

## 2013-02-10 ENCOUNTER — Inpatient Hospital Stay (HOSPITAL_COMMUNITY): Payer: Medicare Other

## 2013-02-10 ENCOUNTER — Encounter (HOSPITAL_COMMUNITY): Payer: Self-pay | Admitting: Orthopedic Surgery

## 2013-02-10 LAB — BASIC METABOLIC PANEL
CO2: 29 mEq/L (ref 19–32)
GFR calc non Af Amer: 59 mL/min — ABNORMAL LOW (ref >90)
Glucose, Bld: 169 mg/dL — ABNORMAL HIGH (ref 70–99)
Potassium: 4.1 mEq/L (ref 3.5–5.1)
Sodium: 141 mEq/L (ref 135–145)

## 2013-02-10 LAB — CBC
Hemoglobin: 10.5 g/dL — ABNORMAL LOW (ref 13.0–17.0)
MCHC: 31.9 g/dL (ref 30.0–36.0)
Platelets: 122 10*3/uL — ABNORMAL LOW (ref 150–400)
RBC: 3.65 MIL/uL — ABNORMAL LOW (ref 4.22–5.81)

## 2013-02-10 MED ORDER — MORPHINE SULFATE 2 MG/ML IJ SOLN: 1.0000 mg | INTRAMUSCULAR | Status: DC | PRN

## 2013-02-10 NOTE — Evaluation (Signed)
Physical Therapy Evaluation Patient Details Name: [name] [name] MRN: [id number] DOB: 01-02-1921 Today's Date: 02/10/2013 Time: 4098-1191 PT Time Calculation (min): 26 min  PT Assessment / Plan / Recommendation History of Present Illness  Pt admitted after fall at home, periprosthetic (TKA)fem fx. s/p IMN.  Clinical Impression  Pt tolerated stand and pivot to recliner today, maintaining TDWB. Pt will benefit from PT to address problems listed. Pt will benefit from SNF for rehab.   PT Assessment  Patient needs continued PT services    Follow Up Recommendations  SNF    Does the patient have the potential to tolerate intense rehabilitation      Barriers to Discharge        Equipment Recommendations  None recommended by PT    Recommendations for Other Services     Frequency Min 3X/week    Precautions / Restrictions Precautions Precautions: Fall Precaution Comments: legally blind, On home O2 Restrictions Weight Bearing Restrictions: Yes RLE Weight Bearing: Touchdown weight bearing   Pertinent Vitals/Pain At rest, no pain. During mobility pain incresed to 7. Pt was given pain medication and ice to R thigh. When pt attempted to move L leg in bed, pt c/o groin discomfort. Pt reported no pain when standing on L during transfer.      Mobility  Bed Mobility Bed Mobility: Supine to Sit;Sitting - Scoot to Edge of Bed Supine to Sit: 1: +2 Total assist;HOB elevated Supine to Sit: Patient Percentage: 20% Sitting - Scoot to Edge of Bed: 1: +2 Total assist Sitting - Scoot to Edge of Bed: Patient Percentage: 30% Details for Bed Mobility Assistance: HOB elevated to facilitate getting into sitting. Both legs moved over to the edge. Pt reports L inner  thigh was painful when he attempted to flex his hip/ Pt reports no further discomfort when standing. Transfers Transfers: Sit to Stand;Stand to Sit;Stand Pivot Transfers Sit to Stand: 1: +2 Total assist;From bed;From elevated  surface Sit to Stand: Patient Percentage: 50% Stand to Sit: 1: +2 Total assist;With upper extremity assist;To chair/3-in-1 Stand to Sit: Patient Percentage: 50% Stand Pivot Transfers: 1: +2 Total assist Stand Pivot Transfers: Patient Percentage: 60% Details for Transfer Assistance: Pt required lifting assistance from Bed and to control body to lower to sitting into chair, multimodal cues for TDWB and for UE use, reach back to recliner. Ambulation/Gait Ambulation/Gait Assistance: Not tested (comment)    Exercises     PT Diagnosis: Difficulty walking;Acute pain  PT Problem List: Decreased strength;Decreased range of motion;Decreased activity tolerance;Decreased mobility;Decreased knowledge of use of DME;Decreased safety awareness;Decreased knowledge of precautions;Pain PT Treatment Interventions: DME instruction;Gait training;Functional mobility training;Therapeutic activities;Therapeutic exercise;Patient/family education     PT Goals(Current goals can be found in the care plan section) Acute Rehab PT Goals Patient Stated Goal: I want to get up. PT Goal Formulation: With patient/family Time For Goal Achievement: 02/24/13 Potential to Achieve Goals: Good  Visit Information  Last PT Received On: 02/10/13 Assistance Needed: +2 History of Present Illness: Pt admitted after fall at home, periprosthetic (TKA)fem fx. s/p IMN.       Prior Functioning  Home Living Family/patient expects to be discharged to:: Skilled nursing facility Living Arrangements: Alone Prior Function Level of Independence: Independent;Needs assistance ADL's / Homemaking Assistance Needed: house work, legally blind. Comments: help for housework and groceries Communication Communication: No difficulties    Cognition  Cognition Arousal/Alertness: Awake/alert Behavior During Therapy: WFL for tasks assessed/performed Overall Cognitive Status: Within Functional Limits for tasks assessed    Extremity/Trunk  Assessment Upper Extremity Assessment Upper Extremity Assessment: Overall WFL for tasks assessed Lower Extremity Assessment Lower Extremity Assessment: RLE deficits/detail RLE Deficits / Details: unable to move leg in bed, support of leg to move to edge and lower to the floor., Pain when flexed at knee. Cervical / Trunk Assessment Cervical / Trunk Assessment: Kyphotic   Balance Balance Balance Assessed: Yes Static Sitting Balance Static Sitting - Balance Support: Bilateral upper extremity supported Static Sitting - Level of Assistance: 5: Stand by assistance  End of Session PT - End of Session Equipment Utilized During Treatment: Gait belt Activity Tolerance: Patient tolerated treatment well Patient left: in chair;with family/visitor present;with call bell/phone within reach  GP     [name] 02/10/2013, 2:40 PM Blanchard Kelch PT [phone]

## 2013-02-10 NOTE — Progress Notes (Signed)
Clinical Social Work Department BRIEF PSYCHOSOCIAL ASSESSMENT 02/10/2013  Patient:  [name] [name], [name] [name]     Account Number:  [id number]     Admit date:  02/09/2013  Clinical Social Worker:  Candie Chroman  Date/Time:  02/10/2013 04:08 PM  Referred by:  Physician  Date Referred:  02/10/2013 Referred for  SNF Placement   Other Referral:   Interview type:   Other interview type:    PSYCHOSOCIAL DATA Living Status:  ALONE Admitted from facility:   Level of care:   Primary support name:  [name] [name] Primary support relationship to patient:  CHILD, ADULT Degree of support available:   supportive    CURRENT CONCERNS Current Concerns  Post-Acute Placement   Other Concerns:    SOCIAL WORK ASSESSMENT / PLAN Pt is an 79 yr old gentleman living at home prior to hospitalization. CSW has met with pt / family to assist with d/c planning. ST Rehab will be needed following hospital d/c. CSW has initiated SNF search and bed offers have been provided. Pt has accepted placement at [facility]. SNF will have an opening on Saturday if pt is stable for d/c. CSW will continue to follow to assist with d/c planning to SNF.   Assessment/plan status:  Psychosocial Support/Ongoing Assessment of Needs Other assessment/ plan:   Information/referral to community resources:   SNF list provided with bed offers.    PATIENT'S/FAMILY'S RESPONSE TO PLAN OF CARE: Pt is looking forward to having rehab at [facility].   Cori Razor LCSW [phone]

## 2013-02-10 NOTE — Progress Notes (Signed)
   Subjective: 1 Day Post-Op Procedure(s) (LRB): INTRAMEDULLARY (IM) RETROGRADE FEMORAL NAILING (Right)  Pt doing well Minimal to no pain in the right leg this morning Patient reports pain as mild.  Objective:   VITALS:   Filed Vitals:   02/10/13 0615  BP: 110/72  Pulse: 80  Temp: 98.1 F (36.7 C)  Resp: 20    Right thigh and knee incisions healing well nv intact distally No rashes or edema Good early rom  LABS  Recent Labs  02/09/13 1715 02/10/13 0458  HGB 13.2 10.5*  HCT 41.2 32.9*  WBC 7.9 9.1  PLT 153 122*     Recent Labs  02/09/13 1715 02/10/13 0458  NA 137 141  K 4.3 4.1  BUN 23 23  CREATININE 1.12 1.07  GLUCOSE 178* 169*     Assessment/Plan: 1 Day Post-Op Procedure(s) (LRB): INTRAMEDULLARY (IM) RETROGRADE FEMORAL NAILING (Right)  Pt doing well PT/OT and determine SNF vs home for therapy  Pain control as needed Pulmonary toilet  Brad Pearson Reasons, MPAS, PA-C  02/10/2013, 8:20 AM

## 2013-02-10 NOTE — Progress Notes (Signed)
Clinical Social Work Department CLINICAL SOCIAL WORK PLACEMENT NOTE 02/10/2013  Patient:  [name] [name], [name] [name]  Account Number:  [id number] Admit date:  02/09/2013  Clinical Social Worker:  Cori Razor, LCSW  Date/time:  02/10/2013 04:18 PM  Clinical Social Work is seeking post-discharge placement for this patient at the following level of care:   SKILLED NURSING   (*CSW will update this form in Epic as items are completed)   02/10/2013  Patient/family provided with [facility] Department of Clinical Social Work's list of facilities offering this level of care within the geographic area requested by the patient (or if unable, by the patient's family).  02/10/2013  Patient/family informed of their freedom to choose among providers that offer the needed level of care, that participate in Medicare, Medicaid or managed care program needed by the patient, have an available bed and are willing to accept the patient.    Patient/family informed of [facility]' ownership interest in [facility], as well as of the fact that they are under no obligation to receive care at this facility.  PASARR submitted to EDS on 02/10/2013 PASARR number received from EDS on 02/10/2013  FL2 transmitted to all facilities in geographic area requested by pt/family on  02/10/2013 FL2 transmitted to all facilities within larger geographic area on   Patient informed that his/her managed care company has contracts with or will negotiate with  certain facilities, including the following:     Patient/family informed of bed offers received:  02/10/2013 Patient chooses bed at [facility] Physician recommends and patient chooses bed at    Patient to be transferred to [facility]   Patient to be transferred to facility by   The following physician request were entered in Epic:   Additional Comments:  Cori Razor LCSW [phone]

## 2013-02-10 NOTE — Progress Notes (Signed)
Utilization review completed.  

## 2013-02-10 NOTE — Progress Notes (Signed)
Physical Therapy Treatment Patient Details Name: [name] [name] MRN: [id number] DOB: 12/28/1920 Today's Date: 02/10/2013 Time: 4098-1191 PT Time Calculation (min): 19 min  PT Assessment / Plan / Recommendation  History of Present Illness Pt admitted after fall at home, periprosthetic (TKA)fem fx. s/p IMN.   PT Comments   Pt tolerates well. Cues for TDWB.  Follow Up Recommendations  SNF     Does the patient have the potential to tolerate intense rehabilitation     Barriers to Discharge        Equipment Recommendations  None recommended by PT    Recommendations for Other Services    Frequency Min 3X/week   Progress towards PT Goals Progress towards PT goals: Progressing toward goals  Plan Current plan remains appropriate    Precautions / Restrictions Precautions Precautions: Fall Precaution Comments: legally blind, On home O2 Restrictions Weight Bearing Restrictions: Yes RLE Weight Bearing: Touchdown weight bearing   Pertinent Vitals/Pain R thigh when knee flexes,.    Mobility  Bed Mobility Bed Mobility: Sit to Supine Supine to Sit: 1: +2 Total assist;HOB elevated Supine to Sit: Patient Percentage: 20% Sitting - Scoot to Edge of Bed: 1: +2 Total assist Sitting - Scoot to Edge of Bed: Patient Percentage: 30% Sit to Supine: 1: +2 Total assist Sit to Supine: Patient Percentage: 10% Details for Bed Mobility Assistance: support for getting legs onto bed and lower trunk. Transfers Transfers: Sit to Stand;Stand to Sit;Stand Pivot Transfers Sit to Stand: 1: +2 Total assist;From elevated surface;From chair/3-in-1 Sit to Stand: Patient Percentage: 50% Stand to Sit: 1: +2 Total assist;With upper extremity assist;To bed Stand to Sit: Patient Percentage: 40% Stand Pivot Transfers: 1: +2 Total assist Stand Pivot Transfers: Patient Percentage: 60% Details for Transfer Assistance: Pt required lifting assistance from Bed and to control body to lower to sitting into chair,  multimodal cues for TDWB and for UE use, reach back to recliner. Ambulation/Gait Ambulation/Gait Assistance: Not tested (comment)    Exercises     PT Diagnosis: Difficulty walking;Acute pain  PT Problem List: Decreased strength;Decreased range of motion;Decreased activity tolerance;Decreased mobility;Decreased knowledge of use of DME;Decreased safety awareness;Decreased knowledge of precautions;Pain PT Treatment Interventions: DME instruction;Gait training;Functional mobility training;Therapeutic activities;Therapeutic exercise;Patient/family education   PT Goals (current goals can now be found in the care plan section) Acute Rehab PT Goals Patient Stated Goal: I want to get up. PT Goal Formulation: With patient/family Time For Goal Achievement: 02/24/13 Potential to Achieve Goals: Good  Visit Information  Last PT Received On: 02/10/13 Assistance Needed: +2 History of Present Illness: Pt admitted after fall at home, periprosthetic (TKA)fem fx. s/p IMN.    Subjective Data  Patient Stated Goal: I want to get up.   Cognition  Cognition Arousal/Alertness: Awake/alert Behavior During Therapy: WFL for tasks assessed/performed Overall Cognitive Status: Within Functional Limits for tasks assessed    Balance  Balance Balance Assessed: Yes Static Sitting Balance Static Sitting - Balance Support: Bilateral upper extremity supported Static Sitting - Level of Assistance: 5: Stand by assistance  End of Session PT - End of Session Equipment Utilized During Treatment: Gait belt Activity Tolerance: Patient tolerated treatment well Patient left: with family/visitor present;with call bell/phone within reach;in bed   GP     [name] 02/10/2013, 3:37 PM

## 2013-02-10 NOTE — Op Note (Signed)
NAME:  [name] [name], [name] [name] NO.:  [id number]  MEDICAL RECORD NO.:  [id number]  LOCATION:  1611                         FACILITY:  [facility]  PHYSICIAN:  Almedia Balls. Ranell Patrick, M.D. DATE OF BIRTH:  1920-10-19  DATE OF PROCEDURE:  02/09/2013 DATE OF DISCHARGE:                              OPERATIVE REPORT   PREOPERATIVE DIAGNOSIS:  Right periprosthetic distal femur fracture.  POSTOPERATIVE DIAGNOSIS:  Right periprosthetic distal femur fracture.  PROCEDURE PERFORMED:  Retrograde intramedullary nailing of right periprosthetic distal femur fracture using [employer] [place] nail.  ATTENDING SURGEON:  Almedia Balls. Ranell Patrick, M.D.  ASSISTANT:  Donnie Coffin. Dixon, PA-C, who scrubbed during the entire procedure and necessary for satisfactory completion of surgery.  ANESTHESIA:  General anesthesia was used.  ESTIMATED BLOOD LOSS:  100 mL.  FLUID REPLACEMENT:  1000 mL of crystalloid.  INSTRUMENT COUNTS:  Correct.  COMPLICATIONS:  There were no complications.  ANTIBIOTICS:  Perioperative antibiotics were given.  INDICATIONS:  The patient is an 79 year old male who suffered a fall while tripping over some oxygen tubing.  The patient sustained an injury to his right knee.  He presented with a swollen painful knee, unable to bear weight.  X-rays obtained demonstrating a nondisplaced distal femur fracture, metaphyseal, periprosthetic.  The patient had severe pain upon examination in the emergency room.  I counseled the patient regarding options for management to include conservative management with immobilization and strict nonweightbearing for 10-12 weeks versus stabilizing the fracture to prevent displacement and to allow for knee range of motion and also to prevent displacement should the patient inadvertently weight bear and also for mobilization to prevent blood clots and his well seating and generalized independence.  The patient elected to proceed with surgery.  Family was present,  agreed with this plan.  Informed consent was obtained.  DESCRIPTION OF PROCEDURE:  After an adequate level of anesthesia was achieved, the patient was positioned supine on a radiolucent operating table.  The patient's right leg was correctly identified, and sterilely prepped and draped in usual manner.  Time-out was called.  We initiated surgery with the knee in flexion on radiolucent triangles with an incision on the bottom portion of the patient's prior total knee incision.  Dissection was done with a 10-blade scalpel to the skin and dissection using a Bovie electrocautery through subcu tissue, which was fairly thin.  We went to the medial side of the patient's patellar tendon and made a small longitudinal incision over there to gain access to the joint.  Fresh hematoma was present.  We evacuated that with suction.  We identified using multiplanar C-arm fluoroscopy.  The open box for this what appears to be an Midwife.  I was able to place the guide pin through that open box up into the femur across the fracture site, which remained nondisplaced.  Once we had our guide pin confirmed on AP and lateral views, we took a cannulated awl and opened up that open box area and then went ahead and placed our ball-tip guidewire across the fracture site and verified its length at 38 cm long with the C-arm proximally.  We verified on AP and lateral views that we were in the  femur with the ball-tipped guidewire.  We went ahead and reamed starting at 10 mm and reaming up to a size 13.5 for size 12 mm diameter nail, and then we went ahead and measured at 38 cm.  We placed the nail retrograde through the knee up into the femur.  We then used the jig distally to place total of 4 locking screws distally to lateral tangential screws and then we had 2 proximal and distal oblique screw using the targeting jig.  Once the screws were in appropriate position, we then locked them in place with  the set screw.  We then removed the jig, we thoroughly irrigated the knee there, and then we placed the knee in extension getting the leg flat on the radiolucent table.  We then brought the C-arm up proximally and obtained perfect circle with a proximal anterior to posterior interlocking hole, the static hole and then we went ahead and made an incision overlying the skin there.  We dissected down bluntly through the muscle fascial layer and then down to the bone, the anterior femur.  I was able to remove some periosteum there in the front of the femur gently with a Cobb.  We then went ahead and guide our x-ray into position for perfect circle and then used the freehand technique and drilled a hole anterior to posterior, measured the length to 34 mm, and introduced a 4.5 proximal locking screw.  We screwed that in position, which we verified the length on the lateral view frog-leg lateral and verified this appropriately through the hole on the AP.  Got our final x-rays of the hip and the knee and we were happy with the position of the nail.  The fracture did not move at all. We thoroughly irrigated all wounds and closed in layers with 0-Vicryl for the parapatellar incision.  We then used 2-0 Vicryl subcu proximally and distally and then staples for the skin.  Sterile compressive bandage was applied with wrapping from the toes up to the thigh area and then a Mepilex proximally.  The patient tolerated the procedure well, was transported safely to the postoperative bed and then to the recovery room.     Almedia Balls. Ranell Patrick, M.D.     SRN/MEDQ  D:  02/09/2013  T:  02/10/2013  Job:  [id number]

## 2013-02-11 LAB — CBC
HCT: 30.2 % — ABNORMAL LOW (ref 39.0–52.0)
Hemoglobin: 9.8 g/dL — ABNORMAL LOW (ref 13.0–17.0)
MCH: 29.4 pg (ref 26.0–34.0)
MCV: 90.7 fL (ref 78.0–100.0)
Platelets: 117 10*3/uL — ABNORMAL LOW (ref 150–400)
RBC: 3.33 MIL/uL — ABNORMAL LOW (ref 4.22–5.81)
WBC: 7 10*3/uL (ref 4.0–10.5)

## 2013-02-11 MED ORDER — HYDROCODONE-ACETAMINOPHEN 5-325 MG PO TABS: 1.0000 | ORAL_TABLET | ORAL | Status: DC | PRN

## 2013-02-11 NOTE — Care Management Note (Signed)
    Page 1 of 1   02/11/2013     3:57:45 PM   CARE MANAGEMENT NOTE 02/11/2013  Patient:  [name] [name], [name] [name]   Account Number:  [id number]  Date Initiated:  02/11/2013  Documentation initiated by:  Colleen Can  Subjective/Objective Assessment:   dx femur fracture; IM nailing     Action/Plan:   SNF rehab   Anticipated DC Date:  02/12/2013   Anticipated DC Plan:  SKILLED NURSING FACILITY  In-house referral  Clinical Social Worker      DC Planning Services  CM consult      Choice offered to / List presented to:             Status of service:  Completed, signed off Medicare Important Message given?  NA - LOS <3 / Initial given by admissions (If response is "NO", the following Medicare IM given date fields will be blank) Date Medicare IM given:   Date Additional Medicare IM given:    Discharge Disposition:    Per UR Regulation:    If discussed at Long Length of Stay Meetings, dates discussed:    Comments:

## 2013-02-11 NOTE — Progress Notes (Signed)
Physical Therapy Treatment Patient Details Name: DINESH ULYSSE MRN: [id number] DOB: 1920-10-09 Today's Date: 02/11/2013 Time: 8295-6213 PT Time Calculation (min): 15 min  PT Assessment / Plan / Recommendation  History of Present Illness Pt admitted after fall at home, periprosthetic (TKA)fem fx. s/p IMN.   PT Comments     Follow Up Recommendations  SNF     Does the patient have the potential to tolerate intense rehabilitation     Barriers to Discharge        Equipment Recommendations  None recommended by PT    Recommendations for Other Services    Frequency Min 3X/week   Progress towards PT Goals Progress towards PT goals: Progressing toward goals  Plan Current plan remains appropriate    Precautions / Restrictions Precautions Precautions: Fall Precaution Comments: legally blind, On home O2 Restrictions Weight Bearing Restrictions: Yes RLE Weight Bearing: Touchdown weight bearing   Pertinent Vitals/Pain     Mobility  Bed Mobility Bed Mobility: Sit to Supine Sitting - Scoot to Edge of Bed: 1: +2 Total assist Sitting - Scoot to Edge of Bed: Patient Percentage: 30% Details for Bed Mobility Assistance: cues for sequence and use of L LE to self assist; physical assist to manage R LE , to bering trunk to upright and to bring to EOB using pad Transfers Transfers: Stand to Sit;Sit to Stand Sit to Stand: 1: +2 Total assist;From elevated surface;From chair/3-in-1 Sit to Stand: Patient Percentage: 40% Stand to Sit: 1: +2 Total assist;With upper extremity assist;To bed Stand to Sit: Patient Percentage: 50% Details for Transfer Assistance: Pt required lifting assistance from Bed and to control body to lower to sitting into chair, multimodal cues for TDWB and for UE use, reach back to recliner. Ambulation/Gait Ambulation/Gait Assistance: 1: +2 Total assist Ambulation/Gait: Patient Percentage: 60% Ambulation Distance (Feet): 3 Feet Assistive device: Rolling  walker Ambulation/Gait Assistance Details: Cues for posture, sequence, position from RW, increased UE WB and TDWB on R Gait Pattern: Step-to pattern;Shuffle;Antalgic;Trunk flexed;Wide base of support General Gait Details: Pt stood with RW and assist x 2 to attempt to urinate with very limited assist    Exercises     PT Diagnosis:    PT Problem List:   PT Treatment Interventions:     PT Goals (current goals can now be found in the care plan section) Acute Rehab PT Goals Patient Stated Goal: Walk again PT Goal Formulation: With patient/family Time For Goal Achievement: 02/24/13 Potential to Achieve Goals: Good  Visit Information  Last PT Received On: 02/11/13 Assistance Needed: +2 History of Present Illness: Pt admitted after fall at home, periprosthetic (TKA)fem fx. s/p IMN.    Subjective Data  Patient Stated Goal: Walk again   Cognition  Cognition Arousal/Alertness: Awake/alert Behavior During Therapy: WFL for tasks assessed/performed Overall Cognitive Status: Within Functional Limits for tasks assessed    Balance     End of Session PT - End of Session Activity Tolerance: Patient limited by fatigue;Patient limited by pain Patient left: in bed;with call bell/phone within reach;with nursing/sitter in room Nurse Communication: Mobility status   GP     Amiri Tritch 02/11/2013, 4:43 PM

## 2013-02-11 NOTE — Progress Notes (Signed)
CSW assisting with d/c planning. Pt plans to d/c to [facility] on SAT if stable for d/c. D/C Summary has been sent to SNF. Weekend CSW will assist with d/c planning to SNF.  Cori Razor LCSW [phone]

## 2013-02-11 NOTE — Progress Notes (Signed)
Pt had not voided since 0630 I&O cath this am.  Pt asked to attempt to void; pt only able to void 50cc.  Pt found to have 888cc in bladder per bladder scan.  Foley placed as this was the 3rd cath due to pt being unable to void;  850cc UOP when foley was placed.

## 2013-02-11 NOTE — Progress Notes (Addendum)
   Subjective: 2 Days Post-Op Procedure(s) (LRB): INTRAMEDULLARY (IM) RETROGRADE FEMORAL NAILING (Right)  Pt resting but awakens easily Minimal pain to right leg this morning Overall pt doing well Patient reports pain as mild.  Objective:   VITALS:   Filed Vitals:   02/11/13 0514  BP: 114/56  Pulse: 72  Temp: 97.9 F (36.6 C)  Resp: 18    Right lower extremity with dressing intact nv intact distally No edema or signs of skin breakdown  LABS  Recent Labs  02/09/13 1715 02/10/13 0458 02/11/13 0500  HGB 13.2 10.5* 9.8*  HCT 41.2 32.9* 30.2*  WBC 7.9 9.1 7.0  PLT 153 122* 117*     Recent Labs  02/09/13 1715 02/10/13 0458  NA 137 141  K 4.3 4.1  BUN 23 23  CREATININE 1.12 1.07  GLUCOSE 178* 169*     Assessment/Plan: 2 Days Post-Op Procedure(s) (LRB): INTRAMEDULLARY (IM) RETROGRADE FEMORAL NAILING (Right)  Agree with SNF placement Plan for discharge tomorrow Continue PT/OT F/u with Dr. Ranell Patrick in 2 weeks Discharge to SNF   [facility], MPAS, PA-C  02/11/2013, 7:04 AM

## 2013-02-11 NOTE — Progress Notes (Signed)
OT Cancellation Note  Patient Details Name: BRAEDEN KENNAN MRN: [id number] DOB: 1920/08/17   Cancelled Treatment:    Reason Eval/Treat Not Completed: Other (comment) Defer OT eval to SNF.  Lennox Laity [phone] 02/11/2013, 11:22 AM

## 2013-02-11 NOTE — Discharge Summary (Addendum)
Physician Discharge Summary   Patient ID: [name] [name] MRN: [id number] DOB/AGE: 1920-08-20 79 y.o.  Admit date: 02/09/2013 Discharge date: 02/12/13  Admission Diagnoses:  Right femur fracture  Discharge Diagnoses:  Same   Surgeries: Procedure(s): INTRAMEDULLARY (IM) RETROGRADE FEMORAL NAILING on 02/09/2013   Consultants: PT/OT  Discharged Condition: Stable  Hospital Course: [name] [name] is an 79 y.o. male who was admitted 02/09/2013 with a chief complaint of  Chief Complaint  Patient presents with  . Knee Injury  , and found to have a diagnosis of <principal problem not specified>.  They were brought to the operating room on 02/09/2013 and underwent the above named procedures.    The patient had an uncomplicated hospital course and was stable for discharge.  The patient had difficulty voiding post operatively.  He had a foley catheter replaced on 02/11/13.  He will need foley removal after a period of bladder rest.  SNF MD can initiate foley clamping trials for catheter removal and consider increase in flomax dose.  Recent vital signs:  Filed Vitals:   02/11/13 0514  BP: 114/56  Pulse: 72  Temp: 97.9 F (36.6 C)  Resp: 18    Recent laboratory studies:  Results for orders placed during the hospital encounter of 02/09/13  CBC WITH DIFFERENTIAL      Result Value Range   WBC 7.9  4.0 - 10.5 K/uL   RBC 4.54  4.22 - 5.81 MIL/uL   Hemoglobin 13.2  13.0 - 17.0 g/dL   HCT 64.4  03.4 - 74.2 %   MCV 90.7  78.0 - 100.0 fL   MCH 29.1  26.0 - 34.0 pg   MCHC 32.0  30.0 - 36.0 g/dL   RDW 59.5  63.8 - 75.6 %   Platelets 153  150 - 400 K/uL   Neutrophils Relative % 91 (*) 43 - 77 %   Neutro Abs 7.1  1.7 - 7.7 K/uL   Lymphocytes Relative 6 (*) 12 - 46 %   Lymphs Abs 0.5 (*) 0.7 - 4.0 K/uL   Monocytes Relative 3  3 - 12 %   Monocytes Absolute 0.3  0.1 - 1.0 K/uL   Eosinophils Relative 0  0 - 5 %   Eosinophils Absolute 0.0  0.0 - 0.7 K/uL   Basophils Relative 0  0  - 1 %   Basophils Absolute 0.0  0.0 - 0.1 K/uL  BASIC METABOLIC PANEL      Result Value Range   Sodium 137  135 - 145 mEq/L   Potassium 4.3  3.5 - 5.1 mEq/L   Chloride 98  96 - 112 mEq/L   CO2 28  19 - 32 mEq/L   Glucose, Bld 178 (*) 70 - 99 mg/dL   BUN 23  6 - 23 mg/dL   Creatinine, Ser 4.33  0.50 - 1.35 mg/dL   Calcium 9.3  8.4 - 29.5 mg/dL   GFR calc non Af Amer 56 (*) >90 mL/min   GFR calc Af Amer 65 (*) >90 mL/min  PROTIME-INR      Result Value Range   Prothrombin Time 13.2  11.6 - 15.2 seconds   INR 1.02  0.00 - 1.49  CBC      Result Value Range   WBC 9.1  4.0 - 10.5 K/uL   RBC 3.65 (*) 4.22 - 5.81 MIL/uL   Hemoglobin 10.5 (*) 13.0 - 17.0 g/dL   HCT 18.8 (*) 41.6 - 60.6 %   MCV 90.1  78.0 -  100.0 fL   MCH 28.8  26.0 - 34.0 pg   MCHC 31.9  30.0 - 36.0 g/dL   RDW 16.1  09.6 - 04.5 %   Platelets 122 (*) 150 - 400 K/uL  BASIC METABOLIC PANEL      Result Value Range   Sodium 141  135 - 145 mEq/L   Potassium 4.1  3.5 - 5.1 mEq/L   Chloride 104  96 - 112 mEq/L   CO2 29  19 - 32 mEq/L   Glucose, Bld 169 (*) 70 - 99 mg/dL   BUN 23  6 - 23 mg/dL   Creatinine, Ser 4.09  0.50 - 1.35 mg/dL   Calcium 9.0  8.4 - 81.1 mg/dL   GFR calc non Af Amer 59 (*) >90 mL/min   GFR calc Af Amer 69 (*) >90 mL/min  CBC      Result Value Range   WBC 7.0  4.0 - 10.5 K/uL   RBC 3.33 (*) 4.22 - 5.81 MIL/uL   Hemoglobin 9.8 (*) 13.0 - 17.0 g/dL   HCT 91.4 (*) 78.2 - 95.6 %   MCV 90.7  78.0 - 100.0 fL   MCH 29.4  26.0 - 34.0 pg   MCHC 32.5  30.0 - 36.0 g/dL   RDW 21.3  08.6 - 57.8 %   Platelets 117 (*) 150 - 400 K/uL  TYPE AND SCREEN      Result Value Range   ABO/RH(D) O POS     Antibody Screen NEG     Sample Expiration 02/12/2013    ABO/RH      Result Value Range   ABO/RH(D) O POS      Discharge Medications:     Medication List    STOP taking these medications       mometasone-formoterol 100-5 MCG/ACT Aero  Commonly known as:  DULERA      TAKE these medications        amLODipine 10 MG tablet  Commonly known as:  NORVASC  Take 10 mg by mouth daily.     aspirin 81 MG tablet  Take 81 mg by mouth daily.     atenolol 50 MG tablet  Commonly known as:  TENORMIN  Take 50 mg by mouth daily.     clonazePAM 0.5 MG tablet  Commonly known as:  KLONOPIN  Take 0.5 mg by mouth at bedtime as needed (sleep). prn     diphenoxylate-atropine 2.5-0.025 MG per tablet  Commonly known as:  LOMOTIL  Take 1-2 tablets by mouth 4 (four) times daily as needed for diarrhea or loose stools.     furosemide 40 MG tablet  Commonly known as:  LASIX  Take 40 mg by mouth daily.     gabapentin 300 MG capsule  Commonly known as:  NEURONTIN  Take 300 mg by mouth at bedtime.     HYDROcodone-acetaminophen 5-325 MG per tablet  Commonly known as:  NORCO/VICODIN  Take 1 tablet by mouth every 6 (six) hours as needed for pain.     HYDROcodone-acetaminophen 5-325 MG per tablet  Commonly known as:  NORCO/VICODIN  Take 1-2 tablets by mouth every 4 (four) hours as needed.     omeprazole 20 MG capsule  Commonly known as:  PRILOSEC  Take 20 mg by mouth daily.     potassium chloride SA 20 MEQ tablet  Commonly known as:  K-DUR,KLOR-CON  Take 2 tablets (40 mEq total) by mouth daily.     predniSONE 10 MG tablet  Commonly known as:  DELTASONE  Take 10 mg by mouth daily.     pregabalin 50 MG capsule  Commonly known as:  LYRICA  Take 50 mg by mouth 2 (two) times daily.     simvastatin 80 MG tablet  Commonly known as:  ZOCOR  Take 40 mg by mouth daily. Take 1/2 tablet     sucralfate 1 G tablet  Commonly known as:  CARAFATE  Take 1 g by mouth 4 (four) times daily.     tamsulosin 0.4 MG Caps capsule  Commonly known as:  FLOMAX  Take 0.4 mg by mouth daily.     tiotropium 18 MCG inhalation capsule  Commonly known as:  SPIRIVA  Place 1 capsule (18 mcg total) into inhaler and inhale daily.     VENTOLIN HFA 108 (90 BASE) MCG/ACT inhaler  Generic drug:  albuterol  USE 2 PUFFS 4  TIMES A DAY AS NEEDED FOR RESCUE.        Diagnostic Studies: Dg Chest 2 View  02/10/2013   CLINICAL DATA:  Shortness of breath, hypertension, history prostate cancer, COPD, hyperlipidemia, sleep apnea  EXAM: CHEST  2 VIEW  COMPARISON:  12/07/2012  FINDINGS: Upper normal heart size post CABG and AVR.  Calcified tortuous thoracic aorta.  Pulmonary vascularity grossly normal.  Emphysematous and minimal bronchitic changes consistent with COPD.  Bibasilar scarring.  No acute infiltrate pleural effusion or pneumothorax. Bones diffusely demineralized.  Prior lumbar fusion.  IMPRESSION: Post CABG and AVR.  COPD with bibasilar scarring.   Electronically Signed   By: Ulyses Southward M.D.   On: 02/10/2013 08:57   Dg Femur Right  02/10/2013   CLINICAL DATA:  ORIF of distal right femur.  EXAM: DG C-ARM 1-60 MIN - NRPT [facility]; RIGHT FEMUR - 2 VIEW  COMPARISON:  02/09/2013  FINDINGS: Remote total knee arthroplasty. Placement intra medullary rod with distal locking screw within the femoral shaft. Alignment anatomic. Proximal locking screw also identified.  IMPRESSION: Internal fixation of distal femoral fracture.   Electronically Signed   By: Jeronimo Greaves M.D.   On: 02/10/2013 07:12   Ct Head Wo Contrast  02/09/2013   CLINICAL DATA:  Fall.  EXAM: CT HEAD WITHOUT CONTRAST  TECHNIQUE: Contiguous axial images were obtained from the base of the skull through the vertex without intravenous contrast.  COMPARISON:  02/23/2010.  FINDINGS: No skull fracture or intracranial hemorrhage.  Small vessel disease type changes without CT evidence of large acute infarct.  Global atrophy without hydrocephalus.  No intracranial mass lesion noted on this unenhanced exam.  .  Vascular calcifications.  Partial opacification left maxillary sinus.  Orbital structures appear to be grossly intact.  IMPRESSION: No skull fracture or intracranial hemorrhage.   Electronically Signed   By: Bridgett Larsson M.D.   On: 02/09/2013 15:48   Dg Knee Complete 4  Views Right  02/09/2013   CLINICAL DATA:  Fall. Right knee pain, injury.  EXAM: RIGHT KNEE - COMPLETE 4+ VIEW  COMPARISON:  None.  FINDINGS: Changes of right knee replacement. There is a fracture through the distal right femoral metaphysis above the femoral component of the knee replacement. Associated moderate joint effusion. Fracture is minimally displaced.  IMPRESSION: Fracture through the distal right femoral metaphysis above the femoral component of the total knee replacement. Moderate joint effusion.   Electronically Signed   By: Charlett Nose M.D.   On: 02/09/2013 15:50   Dg C-arm 1-60 Min-no Report  02/09/2013   CLINICAL DATA: right  femur fracture   C-ARM 1-60 MINUTES  Fluoroscopy was utilized by the requesting physician.  No radiographic  interpretation.     Disposition: 03-Skilled Nursing Facility      Discharge Orders   Future Appointments Provider Department Dept Phone   04/05/2013 9:15 AM Waymon Budge, MD [facility] [phone]   Future Orders Complete By Expires   Call MD / Call 911  As directed    Comments:     If you experience chest pain or shortness of breath, CALL 911 and be transported to the hospital emergency room.  If you develope a fever above 101 F, pus (white drainage) or increased drainage or redness at the wound, or calf pain, call your surgeon's office.   Constipation Prevention  As directed    Comments:     Drink plenty of fluids.  Prune juice may be helpful.  You may use a stool softener, such as Colace (over the counter) 100 mg twice a day.  Use MiraLax (over the counter) for constipation as needed.   Diet - low sodium heart healthy  As directed    Increase activity slowly as tolerated  As directed       Follow-up Information   Follow up with NORRIS,STEVEN R, MD. Call in 2 weeks. [phone])    Specialty:  Orthopedic Surgery   Contact information:   [address] [place] [address] [phone]         Signed: Thea Gist 02/11/2013, 10:02 AM

## 2013-02-12 LAB — CBC
HCT: 29 % — ABNORMAL LOW (ref 39.0–52.0)
Hemoglobin: 9.2 g/dL — ABNORMAL LOW (ref 13.0–17.0)
MCH: 28.6 pg (ref 26.0–34.0)
MCHC: 31.7 g/dL (ref 30.0–36.0)
MCV: 90.1 fL (ref 78.0–100.0)

## 2013-02-12 MED ORDER — HYDROCODONE-ACETAMINOPHEN 5-325 MG PO TABS: 1.0000 | ORAL_TABLET | ORAL | Status: DC | PRN

## 2013-02-12 NOTE — Progress Notes (Signed)
Per MD, Pt ready for d/c.  Notified RN, Pt, and facility.  Pt stated that family is aware.  Admission arranged by Weekday CSW.  Facility ready to receive Pt.  Arranged for transportation.  Providence Crosby, LCSWA Clinical Social Work [phone]

## 2013-02-12 NOTE — Progress Notes (Signed)
Report called to Elease Hashimoto, Charity fundraiser at [facility].

## 2013-02-12 NOTE — Progress Notes (Signed)
Physical Therapy Treatment Patient Details Name: [name] [name] MRN: [id number] DOB: 11-04-1920 Today's Date: 02/12/2013 Time: 6295-2841 PT Time Calculation (min): 13 min  PT Assessment / Plan / Recommendation  History of Present Illness Pt admitted after fall at home, periprosthetic (TKA)fem fx. s/p IMN.   PT Comments   Pt continues impulsive and with difficulty following cues and maintaining TDWB  Follow Up Recommendations  SNF     Does the patient have the potential to tolerate intense rehabilitation     Barriers to Discharge        Equipment Recommendations  None recommended by PT    Recommendations for Other Services    Frequency Min 3X/week   Progress towards PT Goals Progress towards PT goals: Progressing toward goals  Plan Current plan remains appropriate    Precautions / Restrictions Precautions Precautions: Fall Precaution Comments: legally blind, On home O2 Restrictions Weight Bearing Restrictions: Yes RLE Weight Bearing: Touchdown weight bearing   Pertinent Vitals/Pain Min pain at rest; elevated to 6/10 with activity    Mobility  Bed Mobility Bed Mobility: Sit to Supine Supine to Sit: 1: +2 Total assist;HOB elevated Supine to Sit: Patient Percentage: 40% Sitting - Scoot to Edge of Bed: 1: +2 Total assist Sitting - Scoot to Edge of Bed: Patient Percentage: 40% Sit to Supine: 1: +2 Total assist Sit to Supine: Patient Percentage: 20% Details for Bed Mobility Assistance: cues for sequence and use of L LE to self assist; physical assist to manage R LE , to bering trunk to upright and to bring to EOB using pad Transfers Transfers: Stand to Sit;Sit to Stand Sit to Stand: 1: +2 Total assist;From elevated surface;From chair/3-in-1 Sit to Stand: Patient Percentage: 40% Stand to Sit: 1: +2 Total assist;With upper extremity assist;To bed Stand to Sit: Patient Percentage: 50% Stand Pivot Transfers: 1: +2 Total assist Stand Pivot Transfers: Patient Percentage:  60% Details for Transfer Assistance: Pt required lifting assistance from [facility] and to control body to lower to sitting into chair, multimodal cues for TDWB and for UE use, reach back to recliner.    Exercises     PT Diagnosis:    PT Problem List:   PT Treatment Interventions:     PT Goals (current goals can now be found in the care plan section) Acute Rehab PT Goals Patient Stated Goal: Walk again PT Goal Formulation: With patient/family Time For Goal Achievement: 02/24/13 Potential to Achieve Goals: Good  Visit Information  Last PT Received On: 02/12/13 Assistance Needed: +2 History of Present Illness: Pt admitted after fall at home, periprosthetic (TKA)fem fx. s/p IMN.    Subjective Data  Subjective: I need to use the bathroom Patient Stated Goal: Walk again   Cognition  Cognition Arousal/Alertness: Awake/alert Behavior During Therapy: WFL for tasks assessed/performed Overall Cognitive Status: Within Functional Limits for tasks assessed    Balance     End of Session PT - End of Session Equipment Utilized During Treatment: Gait belt Activity Tolerance: Patient limited by fatigue;Patient limited by pain Patient left: in bed;with call bell/phone within reach;with family/visitor present Nurse Communication: Mobility status   GP     Angelo Prindle 02/12/2013, 1:48 PM

## 2013-02-12 NOTE — Progress Notes (Signed)
Physical Therapy Treatment Patient Details Name: [name] [name] MRN: [id number] DOB: 14-Nov-1920 Today's Date: 02/12/2013 Time: 1478-2956 PT Time Calculation (min): 12 min  PT Assessment / Plan / Recommendation  History of Present Illness Pt admitted after fall at home, periprosthetic (TKA)fem fx. s/p IMN.   PT Comments     Follow Up Recommendations  SNF     Does the patient have the potential to tolerate intense rehabilitation     Barriers to Discharge        Equipment Recommendations  None recommended by PT    Recommendations for Other Services    Frequency Min 3X/week   Progress towards PT Goals Progress towards PT goals: Progressing toward goals  Plan Current plan remains appropriate    Precautions / Restrictions Precautions Precautions: Fall Precaution Comments: legally blind, On home O2 Restrictions Weight Bearing Restrictions: Yes RLE Weight Bearing: Touchdown weight bearing   Pertinent Vitals/Pain Min pain at rest.    Mobility  Bed Mobility Bed Mobility: Supine to Sit Supine to Sit: 1: +2 Total assist;HOB elevated Supine to Sit: Patient Percentage: 40% Sitting - Scoot to Edge of Bed: 1: +2 Total assist Sitting - Scoot to Edge of Bed: Patient Percentage: 40% Details for Bed Mobility Assistance: cues for sequence and use of L LE to self assist; physical assist to manage R LE , to bering trunk to upright and to bring to EOB using pad Transfers Transfers: Stand to Sit;Sit to Stand Sit to Stand: 1: +2 Total assist;From elevated surface;From bed Sit to Stand: Patient Percentage: 50% Stand to Sit: 1: +2 Total assist;With upper extremity assist;To chair/3-in-1 Stand to Sit: Patient Percentage: 50% Stand Pivot Transfers: 1: +2 Total assist Stand Pivot Transfers: Patient Percentage: 60% Details for Transfer Assistance: Pt required lifting assistance from Bed and to control body to lower to sitting into chair, multimodal cues for TDWB and for UE use, reach back to  recliner.    Exercises     PT Diagnosis:    PT Problem List:   PT Treatment Interventions:     PT Goals (current goals can now be found in the care plan section) Acute Rehab PT Goals Patient Stated Goal: Walk again PT Goal Formulation: With patient/family Time For Goal Achievement: 02/24/13 Potential to Achieve Goals: Good  Visit Information  Last PT Received On: 02/12/13 Assistance Needed: +2 History of Present Illness: Pt admitted after fall at home, periprosthetic (TKA)fem fx. s/p IMN.    Subjective Data  Subjective: I need to use the bathroom Patient Stated Goal: Walk again   Cognition  Cognition Arousal/Alertness: Awake/alert Behavior During Therapy: WFL for tasks assessed/performed Overall Cognitive Status: Within Functional Limits for tasks assessed    Balance     End of Session PT - End of Session Equipment Utilized During Treatment: Gait belt Activity Tolerance: Patient limited by fatigue;Patient limited by pain Patient left: Other (comment) (On BSC with family and RN in room) Nurse Communication: Mobility status   GP     Memori Sammon 02/12/2013, 1:43 PM

## 2013-04-05 ENCOUNTER — Ambulatory Visit: Payer: Medicare Other | Admitting: Internal Medicine

## 2013-04-25 ENCOUNTER — Encounter: Payer: Self-pay | Admitting: Internal Medicine

## 2013-04-25 ENCOUNTER — Other Ambulatory Visit: Payer: Self-pay | Admitting: Internal Medicine

## 2013-04-25 ENCOUNTER — Ambulatory Visit (INDEPENDENT_AMBULATORY_CARE_PROVIDER_SITE_OTHER): Payer: Medicare Other | Admitting: Internal Medicine

## 2013-04-25 ENCOUNTER — Other Ambulatory Visit: Payer: Self-pay | Admitting: *Deleted

## 2013-04-25 ENCOUNTER — Ambulatory Visit (INDEPENDENT_AMBULATORY_CARE_PROVIDER_SITE_OTHER)
Admission: RE | Admit: 2013-04-25 | Discharge: 2013-04-25 | Disposition: A | Discharge status: Home / Self Care | Payer: Medicare Other | Source: Ambulatory Visit | Attending: Internal Medicine | Admitting: Internal Medicine

## 2013-04-25 VITALS — BP 124/82 | HR 75 | Ht 72.0 in | Wt 179.0 lb

## 2013-04-25 DIAGNOSIS — R0789 Other chest pain: Secondary | ICD-10-CM | POA: Insufficient documentation

## 2013-04-25 DIAGNOSIS — J441 Chronic obstructive pulmonary disease with (acute) exacerbation: Secondary | ICD-10-CM

## 2013-04-25 DIAGNOSIS — R071 Chest pain on breathing: Secondary | ICD-10-CM

## 2013-04-25 DIAGNOSIS — G4733 Obstructive sleep apnea (adult) (pediatric): Secondary | ICD-10-CM

## 2013-04-25 MED ORDER — AMOXICILLIN-POT CLAVULANATE 875-125 MG PO TABS: 1.0000 | ORAL_TABLET | Freq: Two times a day (BID) | ORAL | Status: AC

## 2013-04-25 MED ORDER — FENTANYL 25 MCG/HR TD PT72: 25.0000 ug | MEDICATED_PATCH | TRANSDERMAL | Status: AC

## 2013-04-25 NOTE — Telephone Encounter (Signed)
This has been sent to [facility] [phone]. Nothing further is needed

## 2013-04-25 NOTE — Assessment & Plan Note (Signed)
Chest x-ray was unremarkable in October Persistent rhonchi and retained secretions since then would be consistent with a pneumonia. I doubt heart failure. A newly obstructing neoplasm must be considered. Plan-chest x-ray. If possible pneumonia Will give Augmentin

## 2013-04-25 NOTE — Patient Instructions (Signed)
Order- CXR   Dx R chest wall pain, COPD exacerbation  Script for Duragesic patches- place one over area of chest pain- remove and replace every 72 hours while needed  Oxygen- 2 l/m, can increase to 4 l while awake if needed, such as during exertion.

## 2013-04-25 NOTE — Assessment & Plan Note (Signed)
He has not accepted treatment

## 2013-04-25 NOTE — Progress Notes (Signed)
Patient ID: [name] [name], male    DOB: Feb 06, 1921, 80 y.o.   MRN: [id number]  HPI 12/05/10- 80 yoM former smoker followed for COPD, OSA complicated by HBP Last here June 06, 2010 He thinks his exercise capacity is better on room air than on 2 L oxygen.  He doesn't use CPAP at all, but sleeps with O2. He is leaving the [place], to follow with his local physicians because logistics are too hard to get scripts filled through [place].  He discussed dysphagia with Dr Wilburn Cornelia- told to increase omeprazole from 20 >40mg , but he denies any acid/ heart burn. He wants it left alone.   06/09/11- 80 yoM former smoker followed for COPD, OSA complicated by HBP Has not used CPAP in years. Stays on 2 L of oxygen. Gradually more short of breath with no acute change or event. Denies pain, blood, purulent discharge or palpitation.  12/08/11- 80 yoM former smoker followed for COPD, OSA complicated by HBP   Patient states same as last visit. c/o sob, wheezing, and constant runny nose.  Denies chest pain, chest tightness,and cough.  Findings oxygen prongs bothersome at night. We discussed hypoxia and options. He would like to try a metered rescue inhaler discussed this as well. He has some dry cough which is unchanged with no chest pain, no sputum, no blood or palpitation. Very limited exercise tolerance but he paces himself  08/25/12- 80 yoM former smoker followed for COPD, OSA complicated by HBP FOLLOWS FOR:has noticed past several months that his breathing has gotten worse-low O2 levels and SOB. Pt entered room at 75% on 2.5L/M cont (his portable tank); placed on 3L/M cont our tank. Rechecked sat and got 92%. He doesn't know his meds or his DME company. Note from son reports increased SOB and asks thoughts. Patient indicates more awareness DOB/ DOE x 3-4 weeks, feet swelling more and soles of feet tender. Lasix dose reportedly doubled last week. Prednisone course helped.  Hosp 4/9-14/14 w/ DC summary listing OSA,  COPD, diarrhea. Patient describes mainly an acute diarrheal illness. CXR from that visit looks more like CHF/edema w/ BNP> 1000.  CXR 07/23/12 IMPRESSION:  Mild cardiac silhouette enlargement. Post CABG. Hyperinflation  consistent with COPD.Patchy infiltrative densities are seen in the  right perihilar region, right midlung area and in the right base on  the PA image. There is slight interval increase in the coalescent  infiltrate density in the right perihilar region extending into the  right upper lobe.  There is some posterior basilar infiltrative density and  atelectasis seen on lateral image. This inferior density is  slightly less prominent on the previous study.  Original Report Authenticated ByShanon Brow Call  12/07/12- 80 yoM former smoker followed for COPD, OSA/ failed CPAP complicated by HBP FOLLOWS [id number] this is the best he has been in long time.   Son here Has home help 4 hours per day now. Some occasional pains right upper parasternal area, seem to be related to swallowing. No hangup, cough or phlegm. O2 3L/ [facility] [facility] in [place]. Prednisone maintenance 20 mg daily/ Dr Reynaldo Minium. CT chest 08/27/12 IMPRESSION:  1. Negative for pulmonary embolism.  2. Cardiomegaly with prior aortic valve replacement and prior  CABG.  3. Asymmetric interstitial prominence in the right upper lobe  could reflect asymmetric interstitial edema or infectious  pneumonitis.  4. Nonspecific mild precarinal and right hilar lymphadenopathy.  5. Mild to moderate emphysema.  Original Report Authenticated By: Curlene Dolphin, M.D.  04/25/13-  80 yoM former smoker followed for COPD, OSA/ failed CPAP complicated by HBP Son here FOLLOWS FOR: Pt states his breathing is not doing good; pain on right side-unable to get deep enough breath; cough as well-non productive. Denies fever or chills. Arrived on 2 L O2 88% sat. Put on our O2. Pt at [facility] after femur fx repair in Oct, 2014.  he has declined since he  broke right femur in October, with reduction. Has been at [facility]. Limited activity. Developed a pressure sore on left heel. Persistent nonproductive cough x 2 weeks,  is worse in last 2 days with sharp right chest wall tussive pleuritic pain x 2 days.no recognized fever. Has had 2 rounds of prednisone and one round of antibiotic. Son asked me aside to discuss prognosis, which I explained was poor. CXR 02/10/13 IMPRESSION:  Post CABG and AVR.  COPD with bibasilar scarring.  Electronically Signed  By: Lavonia Dana M.D.  On: 02/10/2013 08:57  Review of Systems- see HPI Constitutional:   No-   weight loss, night sweats, fevers, chills, fatigue, lassitude. HEENT:   No-  headaches, difficulty swallowing, tooth/dental problems, sore throat,       No-  sneezing, itching, ear ache, nasal congestion, post nasal drip,  CV:  +chest pain, orthopnea, PND, no-swelling in lower extremities, No-anasarca, dizziness, palpitations Resp: +  shortness of breath with exertion or at rest.              No- productive cough, + non-productive cough,  No-  coughing up of blood.              No-   change in color of mucus.  No- wheezing.   Skin: Legs itch GI:  No-   heartburn, indigestion, abdominal pain, nausea, vomiting, GU:  MS:  No-   joint pain or swelling.   Neuro- no change Psych:  No- change in mood or affect. No depression or anxiety. + memory loss.  Objective:   Physical Exam General- Alert, Oriented, Affect-appropriate, Distress- none acute. Looks older, weaker.                 [place] Portable 2.5L Wheelchair Skin- excoriated eczema on ankles Lymphadenopathy- none Head- atraumatic            Eyes- Gross vision intact, PERRLA, conjunctivae clear secretions            Ears- Hearing, canals normal            Nose- Clear, No- Septal dev, mucus, polyps, erosion, perforation             Throat- Mallampati II , mucosa clear , drainage- none, tonsils- atrophic Neck- flexible , trachea midline, no stridor  , thyroid nl, carotid no bruit Chest - symmetrical excursion , unlabored           Heart/CV- RRR , 1/6 AS murmur , no gallop  , no rub, nl s1 s2                           - JVD- none , edema-1+, stasis changes-+, varices- none           Lung- + distant with congested rattling cough , dullness-none, rub- none, + pursed lips           Chest wall- + very tender to touch at right anterior lower costal margin. No visible rash and  no audible crepitus. Abd-  Br/ Gen/ Rectal- Not done, not indicated Extrem- cyanosis- none, clubbing, none, atrophy- none, strength- nl Neuro- grossly intact to observation

## 2013-04-25 NOTE — Assessment & Plan Note (Signed)
Suspect tussive rib fracture right lower anterior costal margin. Plan-Duragesic patch, splint with pillow, CXR

## 2013-04-26 ENCOUNTER — Telehealth: Payer: Self-pay | Admitting: Internal Medicine

## 2013-04-26 NOTE — Telephone Encounter (Signed)
I have re faxed RX over. received confirmation this did go through. I called and made Dominica Severin aware. Nothing further needed

## 2013-08-23 ENCOUNTER — Ambulatory Visit: Payer: Medicare Other | Admitting: Internal Medicine

## 2013-09-12 DEATH — deceased

## 2014-04-02 IMAGING — CR DG KNEE COMPLETE 4+V*R*
4 series · 4 of 4 positions shown · non-contrast
Comparison: None.

CLINICAL DATA: Fall. Right knee pain, injury.

EXAM:
RIGHT KNEE - COMPLETE 4+ VIEW

[x knee ap right (1 of 4)]
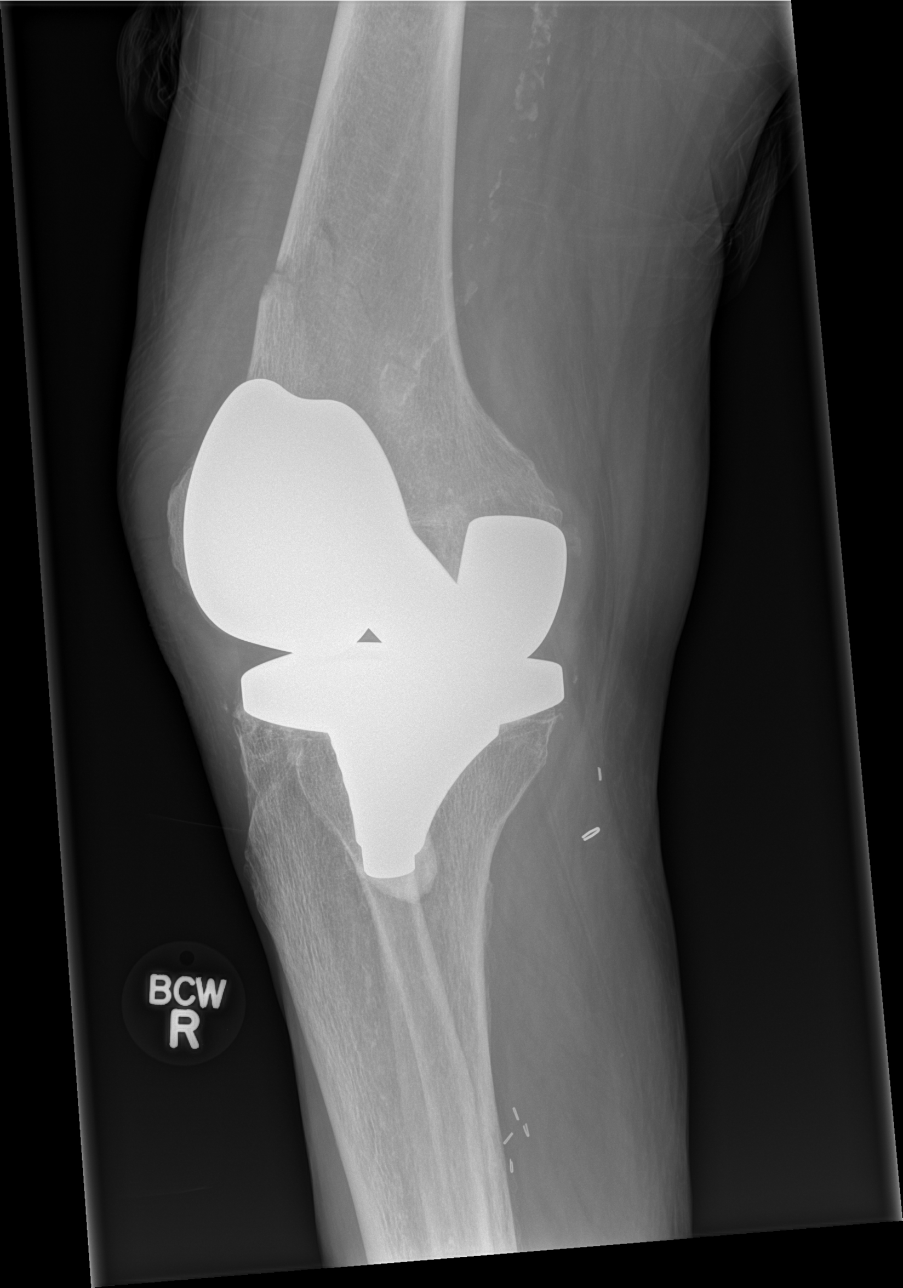

[x knee ap right (2 of 4)]
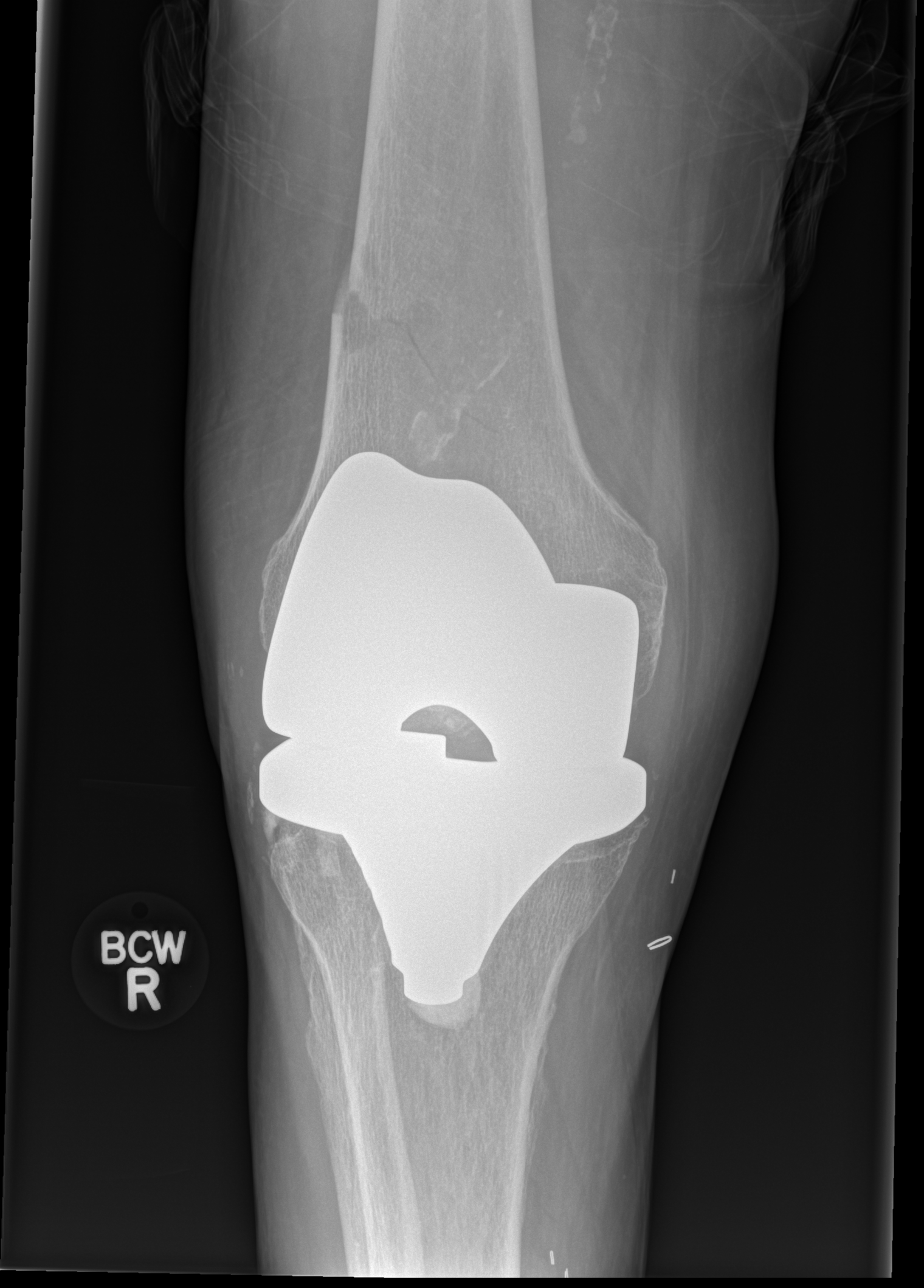

[x knee ap right (3 of 4)]
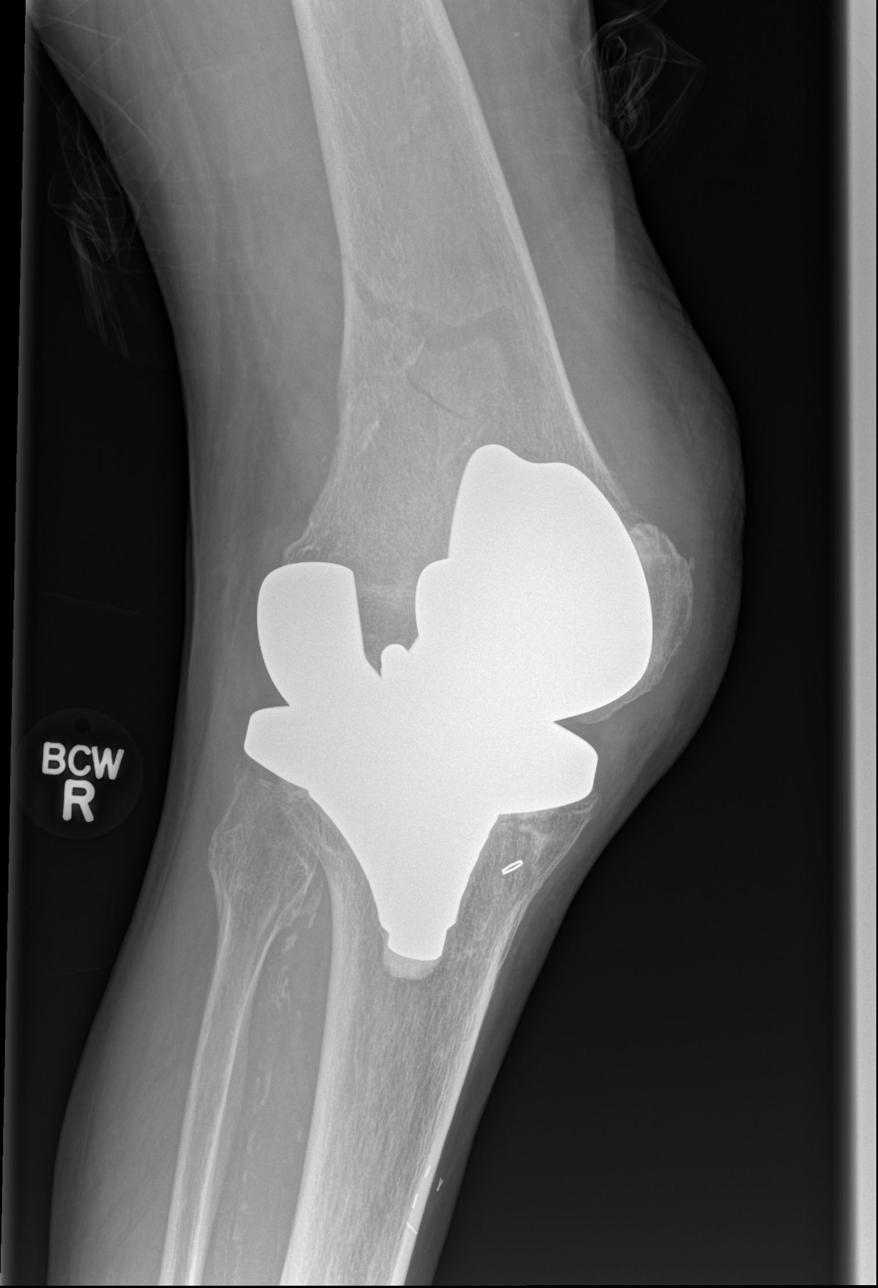

[x knee ap right (4 of 4)]
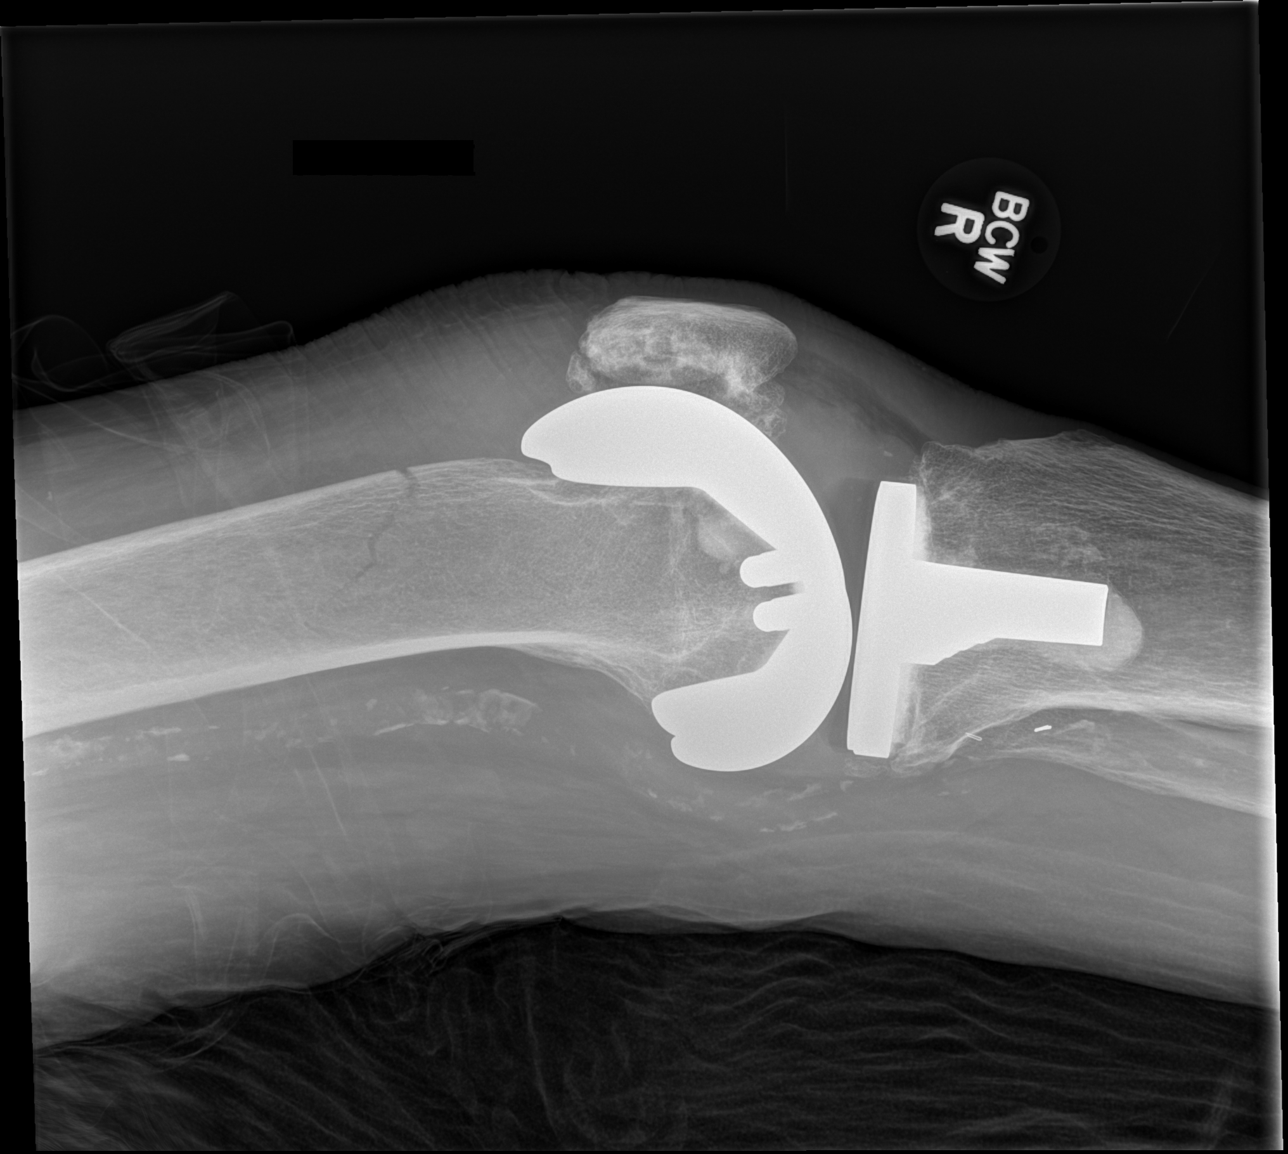

[4 of 4 positions shown; findings below may reference images not displayed]

FINDINGS: Changes of right knee replacement. There is a fracture through the
distal right femoral metaphysis above the femoral component of the
knee replacement. Associated moderate joint effusion. Fracture is
minimally displaced.
IMPRESSION: Fracture through the distal right femoral metaphysis above the
femoral component of the total knee replacement. Moderate joint
effusion.

## 2014-04-03 IMAGING — CR DG CHEST 2V
2 series · 2 of 2 positions shown · non-contrast
Comparison: 12/07/2012

CLINICAL DATA: Shortness of breath, hypertension, history prostate
cancer, COPD, hyperlipidemia, sleep apnea

EXAM:
CHEST  2 VIEW

[w chest lat]
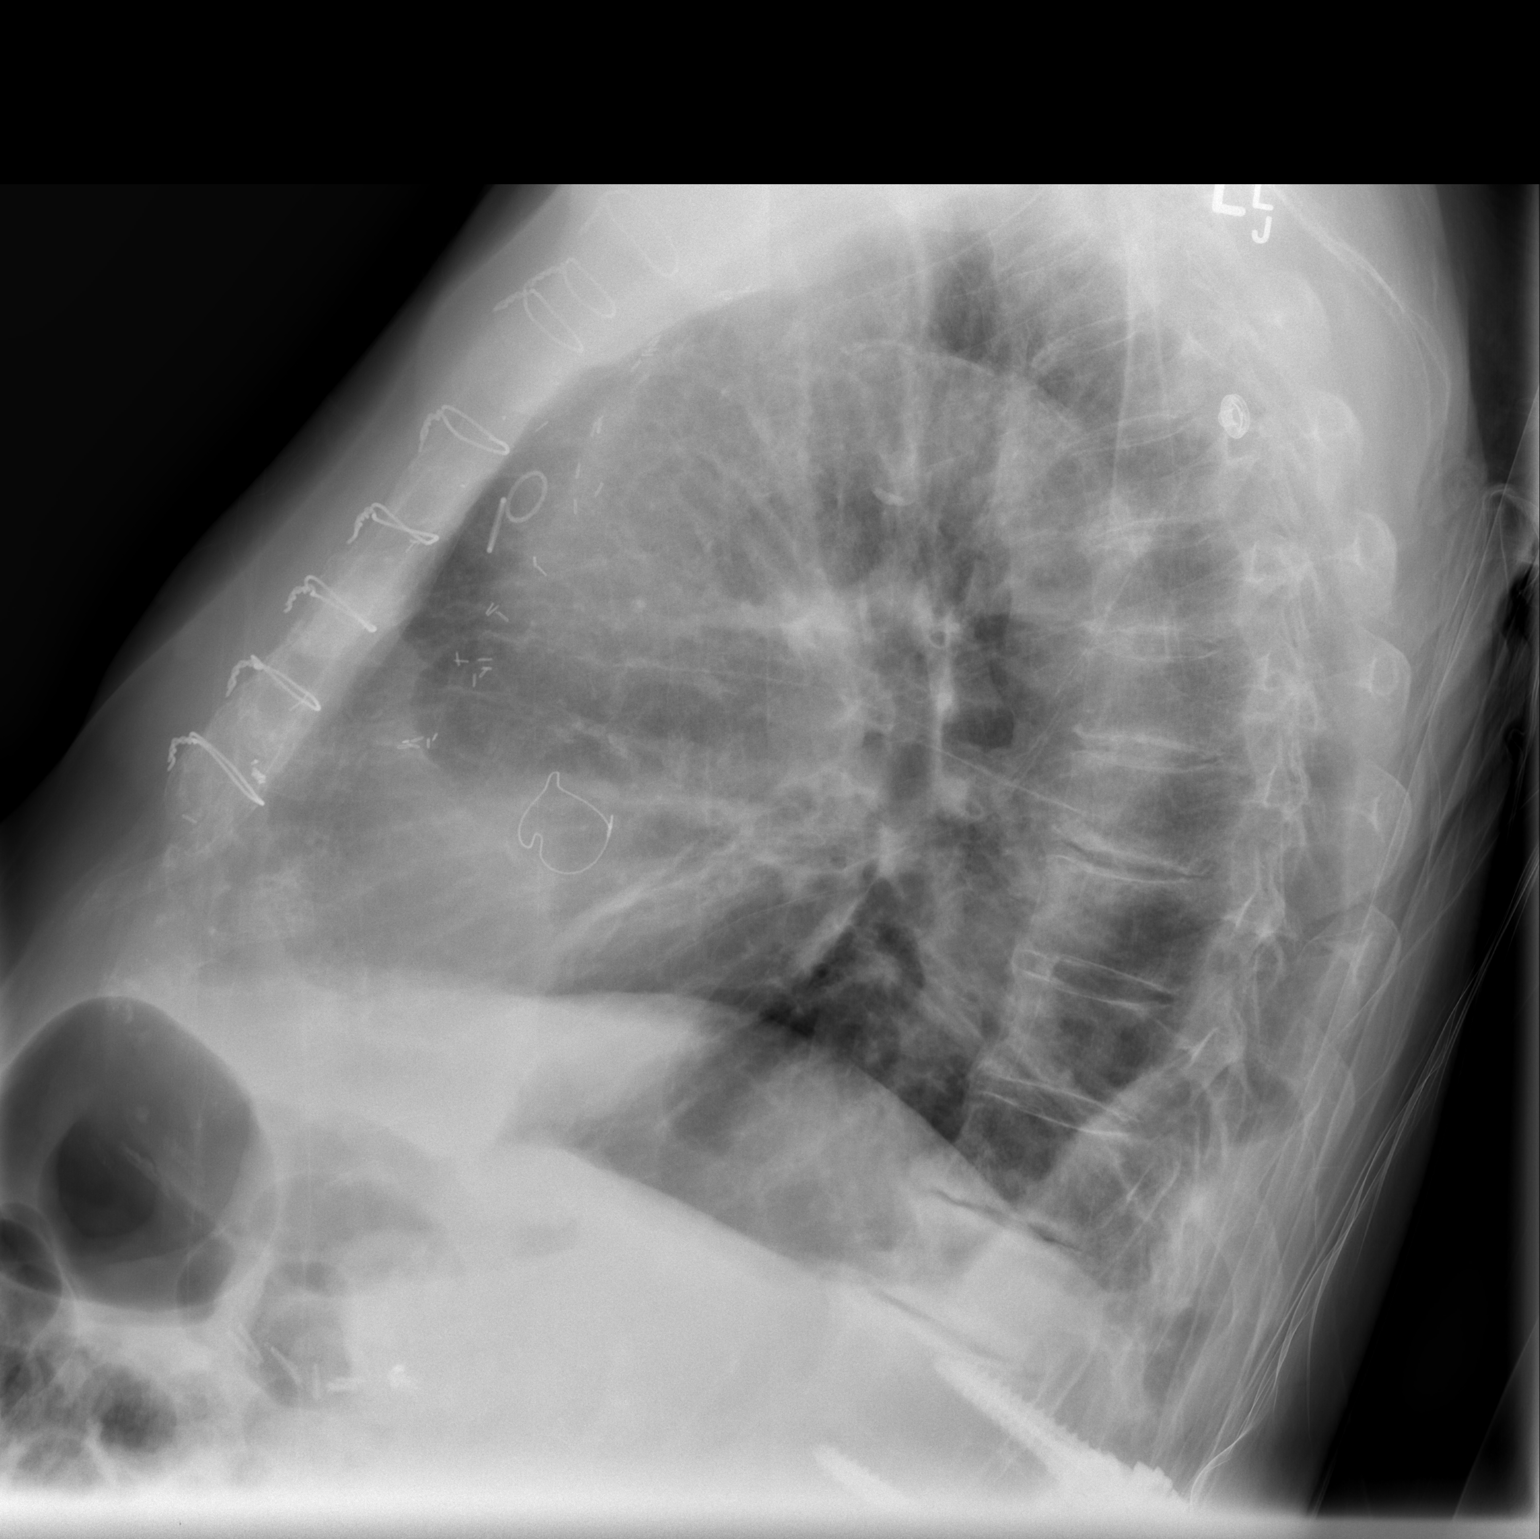

[view not recorded]
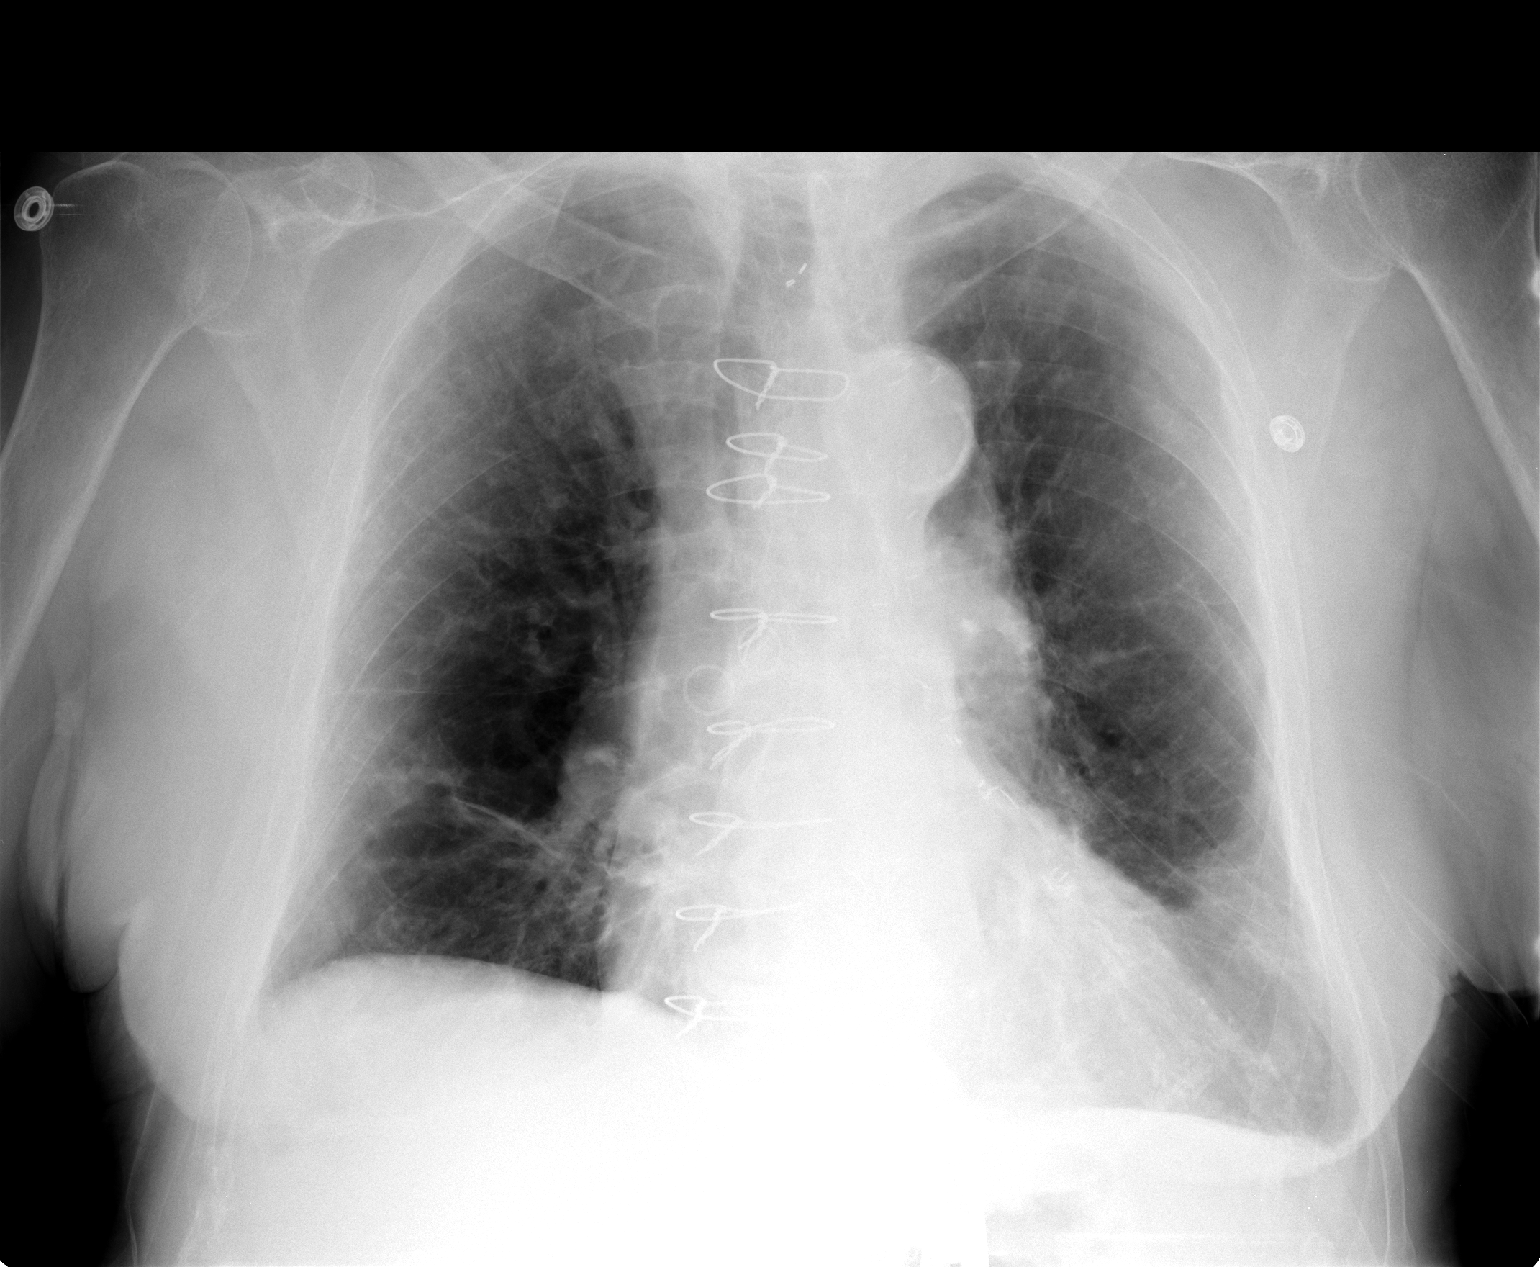

[2 of 2 positions shown; findings below may reference images not displayed]

FINDINGS: Upper normal heart size post CABG and AVR.

Calcified tortuous thoracic aorta.

Pulmonary vascularity grossly normal.

Emphysematous and minimal bronchitic changes consistent with COPD.

Bibasilar scarring.

No acute infiltrate pleural effusion or pneumothorax.
Bones diffusely demineralized.

Prior lumbar fusion.
IMPRESSION: Post CABG and AVR.

COPD with bibasilar scarring.
